# Patient Record
Sex: Female | Born: 1955 | Race: Black or African American | Hispanic: No | Marital: Married | Smoking: Never smoker
Health system: Southern US, Community
[De-identification: ages and names within clinical notes are randomized; demographics above are authoritative.]

## PROBLEM LIST (undated history)

## (undated) DIAGNOSIS — I10 Essential (primary) hypertension: Secondary | ICD-10-CM

## (undated) DIAGNOSIS — E7800 Pure hypercholesterolemia, unspecified: Secondary | ICD-10-CM

## (undated) HISTORY — PX: BREAST BIOPSY: SHX20

---

## 2001-11-14 ENCOUNTER — Emergency Department (HOSPITAL_COMMUNITY): Admission: EM | Admit: 2001-11-14 | Discharge: 2001-11-14 | Payer: Self-pay | Admitting: Internal Medicine

## 2002-05-06 ENCOUNTER — Encounter: Payer: Self-pay | Admitting: Internal Medicine

## 2002-05-06 ENCOUNTER — Ambulatory Visit (HOSPITAL_COMMUNITY): Admission: RE | Admit: 2002-05-06 | Discharge: 2002-05-06 | Payer: Self-pay | Admitting: Internal Medicine

## 2002-06-09 ENCOUNTER — Encounter: Payer: Self-pay | Admitting: Internal Medicine

## 2002-06-09 ENCOUNTER — Ambulatory Visit (HOSPITAL_COMMUNITY): Admission: RE | Admit: 2002-06-09 | Discharge: 2002-06-09 | Payer: Self-pay | Admitting: Internal Medicine

## 2002-06-19 ENCOUNTER — Encounter: Payer: Self-pay | Admitting: Internal Medicine

## 2002-06-19 ENCOUNTER — Ambulatory Visit (HOSPITAL_COMMUNITY): Admission: RE | Admit: 2002-06-19 | Discharge: 2002-06-19 | Payer: Self-pay | Admitting: Internal Medicine

## 2008-02-05 ENCOUNTER — Ambulatory Visit: Payer: Self-pay

## 2010-03-23 ENCOUNTER — Ambulatory Visit: Payer: Self-pay | Admitting: Family Medicine

## 2012-07-23 ENCOUNTER — Ambulatory Visit: Payer: Self-pay | Admitting: Family Medicine

## 2014-11-16 ENCOUNTER — Ambulatory Visit: Payer: Self-pay | Admitting: Nurse Practitioner

## 2014-12-20 ENCOUNTER — Encounter (HOSPITAL_COMMUNITY): Payer: Self-pay | Admitting: Emergency Medicine

## 2014-12-20 ENCOUNTER — Emergency Department (HOSPITAL_COMMUNITY)
Admission: EM | Admit: 2014-12-20 | Discharge: 2014-12-20 | Disposition: A | Discharge status: Home / Self Care | Payer: BLUE CROSS/BLUE SHIELD | Attending: Emergency Medicine | Admitting: Emergency Medicine

## 2014-12-20 ENCOUNTER — Emergency Department (HOSPITAL_COMMUNITY): Payer: BLUE CROSS/BLUE SHIELD

## 2014-12-20 DIAGNOSIS — Z880 Allergy status to penicillin: Secondary | ICD-10-CM | POA: Diagnosis not present

## 2014-12-20 DIAGNOSIS — Z7951 Long term (current) use of inhaled steroids: Secondary | ICD-10-CM | POA: Insufficient documentation

## 2014-12-20 DIAGNOSIS — Z79899 Other long term (current) drug therapy: Secondary | ICD-10-CM | POA: Diagnosis not present

## 2014-12-20 DIAGNOSIS — J019 Acute sinusitis, unspecified: Secondary | ICD-10-CM | POA: Diagnosis not present

## 2014-12-20 DIAGNOSIS — I10 Essential (primary) hypertension: Secondary | ICD-10-CM | POA: Diagnosis not present

## 2014-12-20 DIAGNOSIS — R05 Cough: Secondary | ICD-10-CM

## 2014-12-20 DIAGNOSIS — R9431 Abnormal electrocardiogram [ECG] [EKG]: Secondary | ICD-10-CM | POA: Insufficient documentation

## 2014-12-20 DIAGNOSIS — Z792 Long term (current) use of antibiotics: Secondary | ICD-10-CM | POA: Insufficient documentation

## 2014-12-20 DIAGNOSIS — R9389 Abnormal findings on diagnostic imaging of other specified body structures: Secondary | ICD-10-CM

## 2014-12-20 DIAGNOSIS — R059 Cough, unspecified: Secondary | ICD-10-CM

## 2014-12-20 HISTORY — DX: Pure hypercholesterolemia, unspecified: E78.00

## 2014-12-20 HISTORY — DX: Essential (primary) hypertension: I10

## 2014-12-20 MED ORDER — FLUTICASONE PROPIONATE 50 MCG/ACT NA SUSP: NASAL | Status: DC

## 2014-12-20 MED ORDER — LEVOFLOXACIN 500 MG PO TABS: 500.0000 mg | ORAL_TABLET | Freq: Every day | ORAL | Status: DC

## 2014-12-20 MED ORDER — HYDROCOD POLST-CHLORPHEN POLST 10-8 MG/5ML PO LQCR: 5.0000 mL | Freq: Two times a day (BID) | ORAL | Status: DC

## 2014-12-20 MED ORDER — LEVOFLOXACIN 750 MG PO TABS
750.0000 mg | ORAL_TABLET | Freq: Once | ORAL | Status: AC
  Administered 2014-12-20: 750 mg via ORAL
  Filled 2014-12-20: qty 1

## 2014-12-20 NOTE — ED Provider Notes (Signed)
CSN: [id number]     Arrival date & time 12/20/14  1126 History  This chart was scribed for Rolland Porter, MD by Tonye Royalty, ED Scribe. This patient was seen in room APA10/APA10 and the patient's care was started at 12:11 PM.    Chief Complaint  Patient presents with  . Generalized Body Aches   The history is provided by the patient. No language interpreter was used.    HPI Comments: Dominique Medina is a 59 y.o. female who presents to the Emergency Department complaining of body aches, sore throat, and cough producing green sputum with onset 2 weeks ago, worsening progressively. She reports onset of nasal congestion this week; she states congestion was previously in her chest. She reports associated subjective fever and headache behind her eyes and to the top of her head. She states Robitussin and Tylenol have improved her symptoms. She denies history of asthma, emphysema, or other lung problems. She denies nausea, vomiting, diarrhea, SOB, or pressure in her ears.  Past Medical History  Diagnosis Date  . Hypertension   . High cholesterol    Past Surgical History  Procedure Laterality Date  . Breast biopsy Right    History reviewed. No pertinent family history. History  Substance Use Topics  . Smoking status: Never Smoker   . Smokeless tobacco: Never Used  . Alcohol Use: No   OB History    Gravida Para Term Preterm AB TAB SAB Ectopic Multiple Living   Review of Systems  Constitutional: Positive for fever (subjective). Negative for chills, diaphoresis, appetite change and fatigue.  HENT: Positive for congestion and sore throat. Negative for ear pain, mouth sores and trouble swallowing.   Eyes: Negative for visual disturbance.  Respiratory: Positive for cough. Negative for chest tightness, shortness of breath and wheezing.   Cardiovascular: Negative for chest pain.  Gastrointestinal: Negative for nausea, vomiting, abdominal pain, diarrhea and abdominal distention.   Endocrine: Negative for polydipsia, polyphagia and polyuria.  Genitourinary: Negative for dysuria, frequency and hematuria.  Musculoskeletal: Negative for gait problem.  Skin: Negative for color change, pallor and rash.  Neurological: Positive for headaches. Negative for dizziness, syncope and light-headedness.  Hematological: Does not bruise/bleed easily.  Psychiatric/Behavioral: Negative for behavioral problems and confusion.      Allergies  Penicillins  Home Medications   Prior to Admission medications   Medication Sig Start Date End Date Taking? Authorizing Provider  acetaminophen (TYLENOL) 500 MG tablet Take 500 mg by mouth every 6 (six) hours as needed for fever.   Yes Historical Provider, MD  atorvastatin (LIPITOR) 20 MG tablet Take 1 tablet by mouth daily. 11/14/14  Yes Historical Provider, MD  lisinopril-hydrochlorothiazide (PRINZIDE,ZESTORETIC) 20-12.5 MG per tablet Take 1 tablet by mouth daily. 12/16/14  Yes Historical Provider, MD  Phenyleph-Doxylamine-DM-APAP (ALKA SELTZER PLUS PO) Take 2 tablets by mouth daily as needed (pain).   Yes Historical Provider, MD  potassium chloride (K-DUR) 10 MEQ tablet Take 1 tablet by mouth daily. 12/16/14  Yes Historical Provider, MD  Pseudoeph-Doxylamine-DM-APAP (NYQUIL PO) Take 15 mLs by mouth daily as needed (cold).   Yes Historical Provider, MD  Pseudoephedrine-DM-GG (ROBITUSSIN COLD & COUGH PO) Take 15 mLs by mouth daily as needed (cold).   Yes Historical Provider, MD  chlorpheniramine-HYDROcodone (TUSSIONEX PENNKINETIC ER) 10-8 MG/5ML LQCR Take 5 mLs by mouth every 12 (twelve) hours. 12/20/14   Rolland Porter, MD  fluticasone (FLONASE) 50 MCG/ACT nasal spray 1  spray each nares bid 12/20/14   Rolland PorterMark Keeva Reisen, MD  levofloxacin (LEVAQUIN) 500 MG tablet Take 1 tablet (500 mg total) by mouth daily. 12/20/14   Rolland PorterMark Alfredia Desanctis, MD   BP 139/80 mmHg  Pulse 113  Temp(Src) 98.3 F (36.8 C) (Oral)  Resp 20  Ht 5\' 7"  (1.702 m)  Wt 175 lb (79.379 kg)  BMI  27.40 kg/m2  SpO2 100% Physical Exam  Constitutional: She is oriented to person, place, and time. She appears well-developed and well-nourished. No distress.  HENT:  Head: Normocephalic.  Eyes: Conjunctivae are normal. Pupils are equal, round, and reactive to light. No scleral icterus.  Neck: Normal range of motion. Neck supple. No thyromegaly present.  Cardiovascular: Normal rate and regular rhythm.  Exam reveals no gallop and no friction rub.   No murmur heard. Pulmonary/Chest: Effort normal and breath sounds normal. No respiratory distress. She has no wheezes. She has no rales.  Abdominal: Soft. Bowel sounds are normal. She exhibits no distension. There is no tenderness. There is no rebound.  Musculoskeletal: Normal range of motion.  Neurological: She is alert and oriented to person, place, and time.  Skin: Skin is warm and dry. No rash noted.  Psychiatric: She has a normal mood and affect. Her behavior is normal.  Nursing note and vitals reviewed.   ED Course  Procedures (including critical care time)  DIAGNOSTIC STUDIES: Oxygen Saturation is 100% on room air, normal by my interpretation.    COORDINATION OF CARE: 12:16 PM Discussed treatment plan with patient at beside, antibiotic, inhaler, and cough supressant. The patient agrees with the plan and has no further questions at this time.   Labs Review Labs Reviewed - No data to display  Imaging Review Dg Chest 2 View  12/20/2014   CLINICAL DATA:  Productive cough with weakness and body aches for 2 weeks. History of hypertension. Initial encounter.  EXAM: CHEST  2 VIEW  COMPARISON:  None.  FINDINGS: The heart size and mediastinal contours are normal. There is patchy subpleural opacity anteriorly at the left lung base which is most consistent with atelectasis or scarring. This could reflect resolving pneumonia. The right lung is clear. There is no pleural effusion or pneumothorax. No acute osseous findings are evident.  IMPRESSION:  Patchy subpleural density in the lingula, likely atelectasis or postinflammatory scarring. This could reflect resolving pneumonia. No confluent airspace opacity demonstrated. Radiographic follow up recommended if the patient remains symptomatic.   Electronically Signed   By: Roxy HorsemanBill  Veazey M.D.   On: 12/20/2014 12:14     EKG Interpretation None      MDM   Final diagnoses:  Cough  Acute sinusitis, recurrence not specified, unspecified location  Abnormal chest x-ray   I personally performed the services described in this documentation, which was scribed in my presence. The recorded information has been reviewed and is accurate.   Rolland PorterMark Yuridiana Formanek, MD 12/20/14 1224

## 2014-12-20 NOTE — Discharge Instructions (Signed)
Your chest x-ray is abnormal. It shows an area of scarring, pneumonia (infection), or compression of your lung (atelectasis). After completion of your antibiotics as recommended you see a primary care physician to arrange a follow-up x-ray to ensure that this has resolved. The antibiotic prescribed this for sinus infection. It will also treat a pneumonia.  Cough, Adult  A cough is a reflex that helps clear your throat and airways. It can help heal the body or may be a reaction to an irritated airway. A cough may only last 2 or 3 weeks (acute) or may last more than 8 weeks (chronic).  CAUSES Acute cough:  Viral or bacterial infections. Chronic cough:  Infections.  Allergies.  Asthma.  Post-nasal drip.  Smoking.  Heartburn or acid reflux.  Some medicines.  Chronic lung problems (COPD).  Cancer. SYMPTOMS   Cough.  Fever.  Chest pain.  Increased breathing rate.  High-pitched whistling sound when breathing (wheezing).  Colored mucus that you cough up (sputum). TREATMENT   A bacterial cough may be treated with antibiotic medicine.  A viral cough must run its course and will not respond to antibiotics.  Your caregiver may recommend other treatments if you have a chronic cough. HOME CARE INSTRUCTIONS   Only take over-the-counter or prescription medicines for pain, discomfort, or fever as directed by your caregiver. Use cough suppressants only as directed by your caregiver.  Use a cold steam vaporizer or humidifier in your bedroom or home to help loosen secretions.  Sleep in a semi-upright position if your cough is worse at night.  Rest as needed.  Stop smoking if you smoke. SEEK IMMEDIATE MEDICAL CARE IF:   You have pus in your sputum.  Your cough starts to worsen.  You cannot control your cough with suppressants and are losing sleep.  You begin coughing up blood.  You have difficulty breathing.  You develop pain which is getting worse or is  uncontrolled with medicine.  You have a fever. MAKE SURE YOU:   Understand these instructions.  Will watch your condition.  Will get help right away if you are not doing well or get worse. Document Released: 05/12/2011 Document Revised: 02/05/2012 Document Reviewed: 05/12/2011 [facility] Patient Information 2015 [place], [place]. This information is not intended to replace advice given to you by your health care provider. Make sure you discuss any questions you have with your health care provider.  Sinusitis Sinusitis is redness, soreness, and inflammation of the paranasal sinuses. Paranasal sinuses are air pockets within the bones of your face (beneath the eyes, the middle of the forehead, or above the eyes). In healthy paranasal sinuses, mucus is able to drain out, and air is able to circulate through them by way of your nose. However, when your paranasal sinuses are inflamed, mucus and air can become trapped. This can allow bacteria and other germs to grow and cause infection. Sinusitis can develop quickly and last only a short time (acute) or continue over a long period (chronic). Sinusitis that lasts for more than 12 weeks is considered chronic.  CAUSES  Causes of sinusitis include:  Allergies.  Structural abnormalities, such as displacement of the cartilage that separates your nostrils (deviated septum), which can decrease the air flow through your nose and sinuses and affect sinus drainage.  Functional abnormalities, such as when the small hairs (cilia) that line your sinuses and help remove mucus do not work properly or are not present. SIGNS AND SYMPTOMS  Symptoms of acute and chronic sinusitis are  the same. The primary symptoms are pain and pressure around the affected sinuses. Other symptoms include:  Upper toothache.  Earache.  Headache.  Bad breath.  Decreased sense of smell and taste.  A cough, which worsens when you are lying flat.  Fatigue.  Fever.  Thick  drainage from your nose, which often is green and may contain pus (purulent).  Swelling and warmth over the affected sinuses. DIAGNOSIS  Your health care provider will perform a physical exam. During the exam, your health care provider may:  Look in your nose for signs of abnormal growths in your nostrils (nasal polyps).  Tap over the affected sinus to check for signs of infection.  View the inside of your sinuses (endoscopy) using an imaging device that has a light attached (endoscope). If your health care provider suspects that you have chronic sinusitis, one or more of the following tests may be recommended:  Allergy tests.  Nasal culture. A sample of mucus is taken from your nose, sent to a lab, and screened for bacteria.  Nasal cytology. A sample of mucus is taken from your nose and examined by your health care provider to determine if your sinusitis is related to an allergy. TREATMENT  Most cases of acute sinusitis are related to a viral infection and will resolve on their own within 10 days. Sometimes medicines are prescribed to help relieve symptoms (pain medicine, decongestants, nasal steroid sprays, or saline sprays).  However, for sinusitis related to a bacterial infection, your health care provider will prescribe antibiotic medicines. These are medicines that will help kill the bacteria causing the infection.  Rarely, sinusitis is caused by a fungal infection. In theses cases, your health care provider will prescribe antifungal medicine. For some cases of chronic sinusitis, surgery is needed. Generally, these are cases in which sinusitis recurs more than 3 times per year, despite other treatments. HOME CARE INSTRUCTIONS   Drink plenty of water. Water helps thin the mucus so your sinuses can drain more easily.  Use a humidifier.  Inhale steam 3 to 4 times a day (for example, sit in the bathroom with the shower running).  Apply a warm, moist washcloth to your face 3 to 4  times a day, or as directed by your health care provider.  Use saline nasal sprays to help moisten and clean your sinuses.  Take medicines only as directed by your health care provider.  If you were prescribed either an antibiotic or antifungal medicine, finish it all even if you start to feel better. SEEK IMMEDIATE MEDICAL CARE IF:  You have increasing pain or severe headaches.  You have nausea, vomiting, or drowsiness.  You have swelling around your face.  You have vision problems.  You have a stiff neck.  You have difficulty breathing. MAKE SURE YOU:   Understand these instructions.  Will watch your condition.  Will get help right away if you are not doing well or get worse. Document Released: 11/13/2005 Document Revised: 03/30/2014 Document Reviewed: 11/28/2011 [facility] Patient Information 2015 [place], [place]. This information is not intended to replace advice given to you by your health care provider. Make sure you discuss any questions you have with your health care provider.

## 2014-12-20 NOTE — ED Notes (Signed)
Patient c/o generalized body aches, sore throat, nasal congestion, chills, and productive cough x2 weeks. Per patient progressively getting worse despite using over-counter medication (Robitussin,tylenol, and alchzler plus). Per patient thick green sputum with cough. Unsure of fevers. Denies any nausea, vomiting, or diarrhea.

## 2016-07-06 ENCOUNTER — Other Ambulatory Visit: Payer: Self-pay | Admitting: Internal Medicine

## 2016-07-06 DIAGNOSIS — Z1231 Encounter for screening mammogram for malignant neoplasm of breast: Secondary | ICD-10-CM

## 2016-07-18 ENCOUNTER — Ambulatory Visit
Admission: RE | Admit: 2016-07-18 | Discharge: 2016-07-18 | Disposition: A | Discharge status: Home / Self Care | Payer: BLUE CROSS/BLUE SHIELD | Source: Ambulatory Visit | Attending: Internal Medicine | Admitting: Internal Medicine

## 2016-07-18 ENCOUNTER — Other Ambulatory Visit: Payer: Self-pay | Admitting: Internal Medicine

## 2016-07-18 DIAGNOSIS — Z1231 Encounter for screening mammogram for malignant neoplasm of breast: Secondary | ICD-10-CM | POA: Diagnosis present

## 2016-10-17 ENCOUNTER — Emergency Department: Payer: BLUE CROSS/BLUE SHIELD

## 2016-10-17 ENCOUNTER — Observation Stay
Admission: EM | Admit: 2016-10-17 | Discharge: 2016-10-18 | Disposition: A | Discharge status: Home / Self Care | Payer: BLUE CROSS/BLUE SHIELD | Attending: Internal Medicine | Admitting: Internal Medicine

## 2016-10-17 DIAGNOSIS — Z1623 Resistance to quinolones and fluoroquinolones: Secondary | ICD-10-CM | POA: Insufficient documentation

## 2016-10-17 DIAGNOSIS — Z803 Family history of malignant neoplasm of breast: Secondary | ICD-10-CM | POA: Insufficient documentation

## 2016-10-17 DIAGNOSIS — Z1629 Resistance to other single specified antibiotic: Secondary | ICD-10-CM | POA: Diagnosis not present

## 2016-10-17 DIAGNOSIS — R9431 Abnormal electrocardiogram [ECG] [EKG]: Secondary | ICD-10-CM

## 2016-10-17 DIAGNOSIS — B964 Proteus (mirabilis) (morganii) as the cause of diseases classified elsewhere: Secondary | ICD-10-CM | POA: Insufficient documentation

## 2016-10-17 DIAGNOSIS — Z880 Allergy status to penicillin: Secondary | ICD-10-CM | POA: Insufficient documentation

## 2016-10-17 DIAGNOSIS — E7800 Pure hypercholesterolemia, unspecified: Secondary | ICD-10-CM | POA: Insufficient documentation

## 2016-10-17 DIAGNOSIS — Z79899 Other long term (current) drug therapy: Secondary | ICD-10-CM | POA: Insufficient documentation

## 2016-10-17 DIAGNOSIS — N390 Urinary tract infection, site not specified: Secondary | ICD-10-CM | POA: Diagnosis present

## 2016-10-17 DIAGNOSIS — E860 Dehydration: Principal | ICD-10-CM | POA: Insufficient documentation

## 2016-10-17 DIAGNOSIS — R55 Syncope and collapse: Secondary | ICD-10-CM | POA: Diagnosis present

## 2016-10-17 DIAGNOSIS — J329 Chronic sinusitis, unspecified: Secondary | ICD-10-CM

## 2016-10-17 LAB — URINALYSIS COMPLETE WITH MICROSCOPIC (ARMC ONLY)
Bilirubin Urine: NEGATIVE
GLUCOSE, UA: 50 mg/dL — AB
KETONES UR: NEGATIVE mg/dL
NITRITE: POSITIVE — AB
Protein, ur: 100 mg/dL — AB
Specific Gravity, Urine: 1.014 (ref 1.005–1.030)
pH: 7 (ref 5.0–8.0)

## 2016-10-17 LAB — COMPREHENSIVE METABOLIC PANEL
ALK PHOS: 108 U/L (ref 38–126)
ALT: 37 U/L (ref 14–54)
ANION GAP: 10 (ref 5–15)
AST: 33 U/L (ref 15–41)
Albumin: 4 g/dL (ref 3.5–5.0)
BUN: 17 mg/dL (ref 6–20)
CO2: 28 mmol/L (ref 22–32)
Calcium: 9.4 mg/dL (ref 8.9–10.3)
Chloride: 99 mmol/L — ABNORMAL LOW (ref 101–111)
Creatinine, Ser: 1.36 mg/dL — ABNORMAL HIGH (ref 0.44–1.00)
GFR, EST AFRICAN AMERICAN: 48 mL/min — AB (ref >60)
GFR, EST NON AFRICAN AMERICAN: 42 mL/min — AB (ref >60)
Glucose, Bld: 142 mg/dL — ABNORMAL HIGH (ref 65–99)
Potassium: 2.9 mmol/L — ABNORMAL LOW (ref 3.5–5.1)
SODIUM: 137 mmol/L (ref 135–145)
Total Bilirubin: 0.5 mg/dL (ref 0.3–1.2)
Total Protein: 8.7 g/dL — ABNORMAL HIGH (ref 6.5–8.1)

## 2016-10-17 LAB — CBC
HCT: 43.2 % (ref 35.0–47.0)
HEMOGLOBIN: 15.4 g/dL (ref 12.0–16.0)
MCH: 30.8 pg (ref 26.0–34.0)
MCHC: 35.5 g/dL (ref 32.0–36.0)
MCV: 86.5 fL (ref 80.0–100.0)
Platelets: 342 10*3/uL (ref 150–440)
RBC: 5 MIL/uL (ref 3.80–5.20)
RDW: 13 % (ref 11.5–14.5)
WBC: 9.2 10*3/uL (ref 3.6–11.0)

## 2016-10-17 LAB — INFLUENZA PANEL BY PCR (TYPE A & B)
INFLBPCR: NEGATIVE
Influenza A By PCR: NEGATIVE

## 2016-10-17 LAB — TROPONIN I

## 2016-10-17 MED ORDER — LEVOFLOXACIN IN D5W 750 MG/150ML IV SOLN
750.0000 mg | Freq: Once | INTRAVENOUS | Status: AC
  Administered 2016-10-17: 750 mg via INTRAVENOUS
  Filled 2016-10-17: qty 150

## 2016-10-17 NOTE — ED Notes (Signed)
CT came to get patient . Patient given specimen cup to collect urine when able to

## 2016-10-17 NOTE — ED Notes (Signed)
Admitting MD at bedside.

## 2016-10-17 NOTE — ED Provider Notes (Signed)
[facility] Emergency Department Provider Note   ____________________________________________   First MD Initiated Contact with Patient 10/17/16 2117     (approximate)  I have reviewed the triage vital signs and the nursing notes.   HISTORY  Chief Complaint Loss of Consciousness    HPI Dominique Medina is a 60 y.o. female who reports she's been feeling kind of achy and chilled today was sitting talking to her husband when she just felt woozy got sweaty and passed out. Husband reports she was out for approximately 10 minutes. Patient feels fine now. Does not remember any chest pain or shortness of breath.   Past Medical History:  Diagnosis Date  . High cholesterol   . Hypertension     There are no active problems to display for this patient.   Past Surgical History:  Procedure Laterality Date  . BREAST BIOPSY Right    15 years ago    Prior to Admission medications   Medication Sig Start Date End Date Taking? Authorizing Provider  acetaminophen (TYLENOL) 500 MG tablet Take 500 mg by mouth every 6 (six) hours as needed for fever.   Yes Historical Provider, MD  atorvastatin (LIPITOR) 20 MG tablet Take 1 tablet by mouth daily. 11/14/14  Yes Historical Provider, MD  lisinopril-hydrochlorothiazide (PRINZIDE,ZESTORETIC) 20-12.5 MG per tablet Take 1 tablet by mouth daily. 12/16/14  Yes Historical Provider, MD  Phenyleph-Doxylamine-DM-APAP (ALKA SELTZER PLUS PO) Take 2 tablets by mouth daily as needed (pain).   Yes Historical Provider, MD  potassium chloride (K-DUR) 10 MEQ tablet Take 1 tablet by mouth daily. 12/16/14  Yes Historical Provider, MD  chlorpheniramine-HYDROcodone (TUSSIONEX PENNKINETIC ER) 10-8 MG/5ML LQCR Take 5 mLs by mouth every 12 (twelve) hours. Patient not taking: Reported on 10/17/2016 12/20/14   Rolland PorterMark James, MD  fluticasone [facility](FLONASE) 50 MCG/ACT nasal spray 1 spray each nares bid Patient not taking: Reported on 10/17/2016 12/20/14   Rolland PorterMark  James, MD    Allergies Penicillins  Family History  Problem Relation Age of Onset  . Breast cancer Paternal Aunt 740    Social History Social History  Substance Use Topics  . Smoking status: Never Smoker  . Smokeless tobacco: Never Used  . Alcohol use No    Review of Systems Constitutional: No fever/But did have chills Eyes: No visual changes. ENT: No sore throat. Cardiovascular: Denies chest pain. Respiratory: Denies shortness of breath. Gastrointestinal: No abdominal pain.  No nausea, no vomiting.  No diarrhea.  No constipation. Genitourinary: Negative for dysuria. Musculoskeletal: Negative for back pain. Skin: Negative for rash. Neurological: Negative for headaches, focal weakness or numbness.  10-point ROS otherwise negative.  ____________________________________________   PHYSICAL EXAM:  VITAL SIGNS: ED Triage Vitals  Enc Vitals Group     BP 10/17/16 2049 110/73     Pulse Rate 10/17/16 2049 93     Resp 10/17/16 2049 20     Temp 10/17/16 2049 98.6 F (37 C)     Temp src --      SpO2 10/17/16 2049 97 %     Weight 10/17/16 2050 180 lb (81.6 kg)     Height 10/17/16 2050 5\' 7"  (1.702 m)     Head Circumference --      Peak Flow --      Pain Score --      Pain Loc --      Pain Edu? --      Excl. in GC? --     Constitutional: Alert and oriented.  Well appearing and in no acute distress. Eyes: Conjunctivae are normal. PERRL. EOMI. Head: Atraumatic. Nose: No congestion/rhinnorhea. Mouth/Throat: Mucous membranes are moist.  Oropharynx non-erythematous. Neck: No stridor.  Cardiovascular: Normal rate, regular rhythm. Grossly normal heart sounds.  Good peripheral circulation. Respiratory: Normal respiratory effort.  No retractions. Lungs CTAB. Gastrointestinal: Soft and nontender. No distention. No abdominal bruits. No CVA tenderness. Musculoskeletal: No lower extremity tenderness nor edema.  No joint effusions. Neurologic:  Normal speech and language. No  gross focal neurologic deficits are appreciated. No gait instability.   ____________________________________________   LABS (all labs ordered are listed, but only abnormal results are displayed)  Labs Reviewed  COMPREHENSIVE METABOLIC PANEL - Abnormal; Notable for the following:       Result Value   Potassium 2.9 (*)    Chloride 99 (*)    Glucose, Bld 142 (*)    Creatinine, Ser 1.36 (*)    Total Protein 8.7 (*)    GFR calc non Af Amer 42 (*)    GFR calc Af Amer 48 (*)    All other components within normal limits  URINALYSIS COMPLETEWITH MICROSCOPIC ([facility] ONLY) - Abnormal; Notable for the following:    Color, Urine YELLOW (*)    APPearance CLOUDY (*)    Glucose, UA 50 (*)    Hgb urine dipstick 2+ (*)    Protein, ur 100 (*)    Nitrite POSITIVE (*)    Leukocytes, UA 3+ (*)    Bacteria, UA RARE (*)    Squamous Epithelial / LPF 0-5 (*)    All other components within normal limits  CBC  TROPONIN I  INFLUENZA PANEL BY PCR (TYPE A & B, H1N1)   ____________________________________________  EKG  EKG read and interpreted by me shows sinus tachycardia rate of 102 left axis patient has flipped T's diffusely in the precordial leads and some T-wave flattening inferiorly as well. I do not have any old EKGs to compare to. ____________________________________________  RADIOLOGY  Study Result   CLINICAL DATA:  Nausea, syncope  EXAM: CT HEAD WITHOUT CONTRAST  TECHNIQUE: Contiguous axial images were obtained from the base of the skull through the vertex without intravenous contrast.  COMPARISON:  None.  FINDINGS: Brain: No intracranial hemorrhage, mass effect or midline shift. No acute cortical infarction. No mass lesion is noted on this unenhanced scan.  Vascular: No hyperdense vessel or unexpected calcification.  Skull: Normal. Negative for fracture or focal lesion.  Sinuses/Orbits: There is mucosal thickening with partial opacification right sphenoid sinus. The  mastoid air cells are well aerated.  Other: None  IMPRESSION: 1. No acute intracranial abnormality. No definite acute cortical infarction. Mucosal thickening with partial opacification right sphenoid sinus.   Electronically Signed   By: Natasha MeadLiviu  Pop M.D.   On: 10/17/2016 21:12     ____________________________________________   PROCEDURES  Procedure(s) performed:   Procedures  Critical Care performed:   ____________________________________________   INITIAL IMPRESSION / ASSESSMENT AND PLAN / ED COURSE  Pertinent labs & imaging results that were available during my care of the patient were reviewed by me and considered in my medical decision making (see chart for details).   Clinical Course      ____________________________________________   FINAL CLINICAL IMPRESSION(S) / ED DIAGNOSES  Final diagnoses:  Syncope and collapse  Urinary tract infection without hematuria, site unspecified  Sinusitis, unspecified chronicity, unspecified location  Abnormal EKG      NEW MEDICATIONS STARTED DURING THIS VISIT:  New Prescriptions   No medications  on file     Note:  This document was prepared using Dragon voice recognition software and may include unintentional dictation errors.    Arnaldo Natal, MD 10/17/16 [phone]

## 2016-10-17 NOTE — ED Triage Notes (Signed)
Pt was sitting with husband when she felt nauseous with syncopal episode. Husband states lasted 15 min, states has had cold symptoms for few days.

## 2016-10-18 DIAGNOSIS — R55 Syncope and collapse: Secondary | ICD-10-CM | POA: Diagnosis present

## 2016-10-18 LAB — MAGNESIUM
MAGNESIUM: 2.1 mg/dL (ref 1.7–2.4)
MAGNESIUM: 2.1 mg/dL (ref 1.7–2.4)

## 2016-10-18 LAB — BASIC METABOLIC PANEL
Anion gap: 8 (ref 5–15)
BUN: 14 mg/dL (ref 6–20)
CHLORIDE: 104 mmol/L (ref 101–111)
CO2: 27 mmol/L (ref 22–32)
Calcium: 9.1 mg/dL (ref 8.9–10.3)
Creatinine, Ser: 0.92 mg/dL (ref 0.44–1.00)
GFR calc Af Amer: >60 mL/min (ref >60)
GFR calc non Af Amer: >60 mL/min (ref >60)
GLUCOSE: 104 mg/dL — AB (ref 65–99)
POTASSIUM: 3.1 mmol/L — AB (ref 3.5–5.1)
Sodium: 139 mmol/L (ref 135–145)

## 2016-10-18 LAB — CBC
HEMATOCRIT: 42 % (ref 35.0–47.0)
Hemoglobin: 14.6 g/dL (ref 12.0–16.0)
MCH: 30.3 pg (ref 26.0–34.0)
MCHC: 34.8 g/dL (ref 32.0–36.0)
MCV: 87 fL (ref 80.0–100.0)
Platelets: 313 10*3/uL (ref 150–440)
RBC: 4.83 MIL/uL (ref 3.80–5.20)
RDW: 13.5 % (ref 11.5–14.5)
WBC: 6.8 10*3/uL (ref 3.6–11.0)

## 2016-10-18 LAB — TROPONIN I

## 2016-10-18 LAB — LIPID PANEL
Cholesterol: 159 mg/dL (ref 0–200)
HDL: 53 mg/dL (ref >40)
LDL CALC: 93 mg/dL (ref 0–99)
Total CHOL/HDL Ratio: 3 RATIO
Triglycerides: 64 mg/dL (ref <150)
VLDL: 13 mg/dL (ref 0–40)

## 2016-10-18 LAB — TSH: TSH: 0.737 u[IU]/mL (ref 0.350–4.500)

## 2016-10-18 LAB — PHOSPHORUS: Phosphorus: 2.7 mg/dL (ref 2.5–4.6)

## 2016-10-18 MED ORDER — SODIUM CHLORIDE 0.9% FLUSH
3.0000 mL | Freq: Two times a day (BID) | INTRAVENOUS | Status: DC
  Administered 2016-10-18 (×2): 3 mL via INTRAVENOUS

## 2016-10-18 MED ORDER — ZOLPIDEM TARTRATE 5 MG PO TABS: 5.0000 mg | ORAL_TABLET | Freq: Every evening | ORAL | Status: DC | PRN

## 2016-10-18 MED ORDER — ACETAMINOPHEN 650 MG RE SUPP
650.0000 mg | Freq: Four times a day (QID) | RECTAL | Status: DC | PRN
Start: 2016-10-18 — End: 2016-10-18

## 2016-10-18 MED ORDER — LISINOPRIL 20 MG PO TABS
20.0000 mg | ORAL_TABLET | Freq: Every day | ORAL | Status: DC
  Filled 2016-10-18: qty 1

## 2016-10-18 MED ORDER — LISINOPRIL-HYDROCHLOROTHIAZIDE 20-12.5 MG PO TABS: 1.0000 | ORAL_TABLET | Freq: Every day | ORAL | Status: DC

## 2016-10-18 MED ORDER — HYDROCHLOROTHIAZIDE 12.5 MG PO CAPS
12.5000 mg | ORAL_CAPSULE | Freq: Every day | ORAL | Status: DC
  Administered 2016-10-18: 11:00:00 12.5 mg via ORAL

## 2016-10-18 MED ORDER — HYDROCHLOROTHIAZIDE 12.5 MG PO CAPS
12.5000 mg | ORAL_CAPSULE | Freq: Every day | ORAL | Status: DC
  Filled 2016-10-18: qty 1

## 2016-10-18 MED ORDER — LISINOPRIL 20 MG PO TABS
20.0000 mg | ORAL_TABLET | Freq: Every day | ORAL | Status: DC
  Administered 2016-10-18: 11:00:00 20 mg via ORAL

## 2016-10-18 MED ORDER — HYDROCODONE-ACETAMINOPHEN 5-325 MG PO TABS: 1.0000 | ORAL_TABLET | ORAL | Status: DC | PRN

## 2016-10-18 MED ORDER — SENNOSIDES-DOCUSATE SODIUM 8.6-50 MG PO TABS: 1.0000 | ORAL_TABLET | Freq: Every evening | ORAL | Status: DC | PRN

## 2016-10-18 MED ORDER — ALUM & MAG HYDROXIDE-SIMETH 200-200-20 MG/5ML PO SUSP: 30.0000 mL | Freq: Four times a day (QID) | ORAL | Status: DC | PRN

## 2016-10-18 MED ORDER — LEVOFLOXACIN IN D5W 250 MG/50ML IV SOLN
250.0000 mg | INTRAVENOUS | Status: DC
  Filled 2016-10-18: qty 50

## 2016-10-18 MED ORDER — MAGNESIUM CITRATE PO SOLN: 1.0000 | Freq: Once | ORAL | Status: DC | PRN

## 2016-10-18 MED ORDER — SODIUM CHLORIDE 0.9 % IV SOLN
INTRAVENOUS | Status: DC
  Administered 2016-10-18: 03:00:00 via INTRAVENOUS

## 2016-10-18 MED ORDER — ONDANSETRON HCL 4 MG/2ML IJ SOLN: 4.0000 mg | Freq: Four times a day (QID) | INTRAMUSCULAR | Status: DC | PRN

## 2016-10-18 MED ORDER — ATORVASTATIN CALCIUM 20 MG PO TABS
20.0000 mg | ORAL_TABLET | Freq: Every day | ORAL | Status: DC
  Filled 2016-10-18: qty 1

## 2016-10-18 MED ORDER — CIPROFLOXACIN HCL 500 MG PO TABS: 500.0000 mg | ORAL_TABLET | Freq: Two times a day (BID) | ORAL | 0 refills | Status: DC

## 2016-10-18 MED ORDER — BISACODYL 5 MG PO TBEC: 5.0000 mg | DELAYED_RELEASE_TABLET | Freq: Every day | ORAL | Status: DC | PRN

## 2016-10-18 MED ORDER — POTASSIUM CHLORIDE CRYS ER 20 MEQ PO TBCR
40.0000 meq | EXTENDED_RELEASE_TABLET | Freq: Two times a day (BID) | ORAL | Status: DC
  Administered 2016-10-18 (×2): 40 meq via ORAL
  Filled 2016-10-18 (×2): qty 2

## 2016-10-18 MED ORDER — ONDANSETRON HCL 4 MG PO TABS: 4.0000 mg | ORAL_TABLET | Freq: Four times a day (QID) | ORAL | Status: DC | PRN

## 2016-10-18 MED ORDER — ACETAMINOPHEN 325 MG PO TABS: 650.0000 mg | ORAL_TABLET | Freq: Four times a day (QID) | ORAL | Status: DC | PRN

## 2016-10-18 MED ORDER — ENOXAPARIN SODIUM 40 MG/0.4ML ~~LOC~~ SOLN: 40.0000 mg | SUBCUTANEOUS | Status: DC

## 2016-10-18 NOTE — Discharge Instructions (Signed)
Resume diet and activity as before ° ° °

## 2016-10-18 NOTE — ED Notes (Signed)
Pt assisted to rest room 1x assist

## 2016-10-18 NOTE — Progress Notes (Signed)
Pharmacy Antibiotic Note  Dominique Medina is a 60 y.o. female admitted on 10/17/2016 with UTI.  Pharmacy has been consulted for levofloxacin dosing.  Plan: Levofloxacin 250 mg IV Q24H  Height: 5\' 7"  (170.2 cm) Weight: 180 lb (81.6 kg) IBW/kg (Calculated) : 61.6  Temp (24hrs), Avg:98.3 F (36.8 C), Min:98 F (36.7 C), Max:98.6 F (37 C)   Recent Labs Lab 10/17/16 2055  WBC 9.2  CREATININE 1.36*    Estimated Creatinine Clearance: 48.9 mL/min (by C-G formula based on SCr of 1.36 mg/dL (H)).    Allergies  Allergen Reactions  . Penicillins Itching    Has patient had a PCN reaction causing immediate rash, facial/tongue/throat swelling, SOB or lightheadedness with hypotension: Yes Has patient had a PCN reaction causing severe rash involving mucus membranes or skin necrosis: No Has patient had a PCN reaction that required hospitalization No Has patient had a PCN reaction occurring within the last 10 years: No If all of the above answers are "NO", then may proceed with Cephalosporin use.    Thank you for allowing pharmacy to be a part of this patient's care.  Carola FrostNathan A Cuauhtemoc Huegel, Pharm.D., BCPS Clinical Pharmacist 10/18/2016 2:47 AM

## 2016-10-18 NOTE — Progress Notes (Signed)
Pt has been discharged home. Discharge papers given and explained to pt. Pt verbalized understanding. F/U appointments and meds reviewed with pt. RX given to pt. Pt declined to be escorted in a wheelchair.

## 2016-10-18 NOTE — H&P (Signed)
[facility] [place] [facility] Admission History and Physical [employer] Uvaldo Rybacki, D.O.  ---------------------------------------------------------------------------------------------------------------------   PATIENT NAME: Dominique Medina Bora MR#: [id number] DATE OF BIRTH: 02-15-1956 DATE OF ADMISSION: 10/17/2016 PRIMARY CARE PHYSICIAN: PROVIDER NOT IN SYSTEM  REQUESTING/REFERRING PHYSICIAN: ED Dr. Darnelle CatalanMalinda  CHIEF COMPLAINT: Chief Complaint  Patient presents with  . Loss of Consciousness    HISTORY OF PRESENT ILLNESS: Dominique Medina Oesterling is a 60 y.o. female with a known history of Hypertension, hyperlipidemia presents to the emergency department for evaluation of syncope  patient states that she was in her usual state of health until this morning when she started feeling chilly and achy with sweats intermittently throughout the day. She states that she ate breakfast lunch and dinner but after dinner she was sitting down, felt dizzy and lightheaded, sweaty and as her husband was going to help her he states that she slumped over and lost consciousness. He reports she was unconscious or intermittently conscious for about 10 minutes. He states that she did not have any seizure-like activity, no loss of continence or tongue biting. Patient states that she does not recall any of the events surrounding her loss of consciousness but states that she feels like she is back to baseline, not confsed.  Of note patient states that she has not seen a cardiologist or had any kind of cardiac workup in the past.  Otherwise there has been no change in status. Patient has been taking medication as prescribed and there has been no recent change in medication or diet.  There has been no recent illness, travel or sick contacts.    Patient denies fevers/chills, weakness, dizziness, chest pain, shortness of breath, N/V/C/D, abdominal pain, dysuria/frequency, changes in mental status.   EMS/ED COURSE:   Patient received  Levaquin.  PAST MEDICAL HISTORY: Past Medical History:  Diagnosis Date  . High cholesterol   . Hypertension       PAST SURGICAL HISTORY: Past Surgical History:  Procedure Laterality Date  . BREAST BIOPSY Right    15 years ago      SOCIAL HISTORY: Social History  Substance Use Topics  . Smoking status: Never Smoker  . Smokeless tobacco: Never Used  . Alcohol use No      FAMILY HISTORY: Family History  Problem Relation Age of Onset  . Breast cancer Paternal Aunt [id number]     MEDICATIONS AT HOME: Prior to Admission medications   Medication Sig Start Date End Date Taking? Authorizing Provider  acetaminophen (TYLENOL) 500 MG tablet Take 500 mg by mouth every 6 (six) hours as needed for fever.   Yes Historical Provider, MD  atorvastatin (LIPITOR) 20 MG tablet Take 1 tablet by mouth daily. 11/14/14  Yes Historical Provider, MD  lisinopril-hydrochlorothiazide (PRINZIDE,ZESTORETIC) 20-12.5 MG per tablet Take 1 tablet by mouth daily. 12/16/14  Yes Historical Provider, MD  Phenyleph-Doxylamine-DM-APAP (ALKA SELTZER PLUS PO) Take 2 tablets by mouth daily as needed (pain).   Yes Historical Provider, MD  potassium chloride (K-DUR) 10 MEQ tablet Take 1 tablet by mouth daily. 12/16/14  Yes Historical Provider, MD  chlorpheniramine-HYDROcodone (TUSSIONEX PENNKINETIC ER) 10-8 MG/5ML LQCR Take 5 mLs by mouth every 12 (twelve) hours. Patient not taking: Reported on 10/17/2016 12/20/14   Rolland PorterMark James, MD  fluticasone [facility](FLONASE) 50 MCG/ACT nasal spray 1 spray each nares bid Patient not taking: Reported on 10/17/2016 12/20/14   Rolland PorterMark James, MD      DRUG ALLERGIES: Allergies  Allergen Reactions  . Penicillins Itching    Has patient had  a PCN reaction causing immediate rash, facial/tongue/throat swelling, SOB or lightheadedness with hypotension: Yes Has patient had a PCN reaction causing severe rash involving mucus membranes or skin necrosis: No Has patient had a PCN reaction that required  hospitalization No Has patient had a PCN reaction occurring within the last 10 years: No If all of the above answers are "NO", then may proceed with Cephalosporin use.     REVIEW OF SYSTEMS: CONSTITUTIONAL: Positive fatigue, weakness, fever, chills, negative weight gain/loss, headache EYES: No blurry or double vision. ENT: No tinnitus, postnasal drip, redness or soreness of the oropharynx. Positive sinus congestion RESPIRATORY: No dyspnea, cough, wheeze, hemoptysis. CARDIOVASCULAR: No chest pain, orthopnea, palpitations, syncope. GASTROINTESTINAL: No nausea, vomiting, constipation, diarrhea, abdominal pain. No hematemesis, melena or hematochezia. GENITOURINARY: No dysuria, frequency, hematuria. ENDOCRINE: No polyuria or nocturia. No heat or cold intolerance. HEMATOLOGY: No anemia, bruising, bleeding. INTEGUMENTARY: No rashes, ulcers, lesions. MUSCULOSKELETAL: No pain, arthritis, swelling, gout. NEUROLOGIC: No numbness, tingling, weakness or ataxia. No seizure-type activity. PSYCHIATRIC: No anxiety, depression, insomnia.  PHYSICAL EXAMINATION: VITAL SIGNS: Blood pressure 128/86, pulse 93, temperature 98.6 F (37 C), resp. rate 19, height 5\' 7"  (1.702 m), weight 81.6 kg (180 lb), SpO2 97 %.  GENERAL: 60 y.o.-year-old black female patient, well-developed, well-nourished lying in the bed in no acute distress.  Pleasant and cooperative.   HEENT: Head atraumatic, normocephalic. Pupils equal, round, reactive to light and accommodation. No scleral icterus. Extraocular muscles intact. Oropharynx is clear. Mucus membranes moist. NECK: Supple, full range of motion. No JVD, no bruit heard. No cervical lymphadenopathy. CHEST: Normal breath sounds bilaterally. No wheezing, rales, rhonchi or crackles. No use of accessory muscles of respiration.  No reproducible chest wall tenderness.  CARDIOVASCULAR: S1, S2 normal. No murmurs, rubs, or gallops appreciated. Cap refill <2 seconds. ABDOMEN: Soft,  nontender, nondistended. No rebound, guarding, rigidity. Normoactive bowel sounds present in all four quadrants. No organomegaly or mass. EXTREMITIES: Full range of motion. No pedal edema, cyanosis, or clubbing. NEUROLOGIC: Cranial nerves II through XII are grossly intact with no focal sensorimotor deficit. Muscle strength 5/5 in all extremities. Sensation intact. Gait not checked. PSYCHIATRIC: The patient is alert and oriented x 3. Normal affect, mood, thought content. SKIN: Warm, dry, and intact without obvious rash, lesion, or ulcer.  LABORATORY PANEL:  CBC  Recent Labs Lab 10/17/16 2055  WBC 9.2  HGB 15.4  HCT 43.2  PLT 342   ----------------------------------------------------------------------------------------------------------------- Chemistries  Recent Labs Lab 10/17/16 2055  NA 137  K 2.9*  CL 99*  CO2 28  GLUCOSE 142*  BUN 17  CREATININE 1.36*  CALCIUM 9.4  MG 2.1  AST 33  ALT 37  ALKPHOS 108  BILITOT 0.5   ------------------------------------------------------------------------------------------------------------------ Cardiac Enzymes  Recent Labs Lab 10/17/16 2055  TROPONINI <0.03   ------------------------------------------------------------------------------------------------------------------  RADIOLOGY: Ct Head Wo Contrast  Result Date: 10/17/2016 CLINICAL DATA:  Nausea, syncope EXAM: CT HEAD WITHOUT CONTRAST TECHNIQUE: Contiguous axial images were obtained from the base of the skull through the vertex without intravenous contrast. COMPARISON:  None. FINDINGS: Brain: No intracranial hemorrhage, mass effect or midline shift. No acute cortical infarction. No mass lesion is noted on this unenhanced scan. Vascular: No hyperdense vessel or unexpected calcification. Skull: Normal. Negative for fracture or focal lesion. Sinuses/Orbits: There is mucosal thickening with partial opacification right sphenoid sinus. The mastoid air cells are well aerated.  Other: None IMPRESSION: 1. No acute intracranial abnormality. No definite acute cortical infarction. Mucosal thickening with partial opacification right sphenoid sinus. Electronically Signed  By: Natasha Mead M.D.   On: 10/17/2016 21:12    EKG: Sinus tachycardia at 102 bpm with leftward axis, flipped T waves in the precordial leads and nonspecific ST-T wave changes.   IMPRESSION AND PLAN:  This is a 59 y.o. female with a history of hypertension, hyperlipidemia now being admitted with: 1. Syncope, possibly related to dehydration and infection. I will admit her for observation with telemetry monitoring, trend troponins, check orthostatics, echo and carotids, TSH and lipids. 2. Urinary tract infection. Patient is asymptomatic however given the syncopal episode we'll treat the urinary tract infection present on clean catch urinalysis and follow up urine cultures. 3. AK I, unclear if patient has baseline chronic kidney disease however her story is consistent with dehydration and therefore we will treat with gentle IV fluid rehydration 4. Hypokalemia. Patient is on hydrochlorothiazide and potassium as an outpatient. We'll replace orally and recheck BMP in the a.m. 5. History of hyper-tension-continue lisinopril and HCTZ with potassium 6. History of hyperlipidemia-continue Lipitor  Diet/Nutrition: Heart healthy Fluids: IV normal saline DVT Px: Lovenox, SCDs and early ambulation Code Status: Full  All the records are reviewed and case discussed with ED provider. Management plans discussed with the patient and/or family who express understanding and agree with plan of care.   TOTAL TIME TAKING CARE OF THIS PATIENT: 60 minutes.   Caraline Deutschman D.O. on 10/18/2016 at 1:02 AM Between 7am to 6pm - Pager - [phone] After 6pm go to [email or web address] - Social research officer, government [facility] [place] [facility] Office [phone] CC: Primary care physician; PROVIDER NOT IN SYSTEM     Note: This  dictation was prepared with Dragon dictation along with smaller phrase technology. Any transcriptional errors that result from this process are unintentional.

## 2016-10-20 LAB — URINE CULTURE

## 2016-10-24 NOTE — Discharge Summary (Signed)
[facility] - [place] at [facility]   PATIENT NAME: Dominique Medina    MR#:  [id number]  DATE OF BIRTH:  1956/05/08  DATE OF ADMISSION:  10/17/2016 ADMITTING PHYSICIAN: Jon Gills Hugelmeyer, DO  DATE OF DISCHARGE: 10/18/2016  2:00 PM  PRIMARY CARE PHYSICIAN: PROVIDER NOT IN SYSTEM   ADMISSION DIAGNOSIS:  Syncope and collapse [R55] Abnormal EKG [R94.31] Urinary tract infection without hematuria, site unspecified [N39.0] Sinusitis, unspecified chronicity, unspecified location [J32.9]  DISCHARGE DIAGNOSIS:  Active Problems:   Syncope   SECONDARY DIAGNOSIS:   Past Medical History:  Diagnosis Date  . High cholesterol   . Hypertension      ADMITTING HISTORY  Dominique Medina is a 60 y.o. female with a known history of Hypertension, hyperlipidemia presents to the emergency department for evaluation of syncope  patient states that she was in her usual state of health until this morning when she started feeling chilly and achy with sweats intermittently throughout the day. She states that she ate breakfast lunch and dinner but after dinner she was sitting down, felt dizzy and lightheaded, sweaty and as her husband was going to help her he states that she slumped over and lost consciousness. He reports she was unconscious or intermittently conscious for about 10 minutes. He states that she did not have any seizure-like activity, no loss of continence or tongue biting. Patient states that she does not recall any of the events surrounding her loss of consciousness but states that she feels like she is back to baseline, not confsed.  Of note patient states that she has not seen a cardiologist or had any kind of cardiac workup in the past.  Otherwise there has been no change in status. Patient has been taking medication as prescribed and there has been no recent change in medication or diet.  There has been no recent illness, travel or sick contacts.    Patient denies  fevers/chills, weakness, dizziness, chest pain, shortness of breath, N/V/C/D, abdominal pain, dysuria/frequency, changes in mental status.  HOSPITAL COURSE:   * UTI Started on IV abx in hospital Discharge on PO abx. Afebrile and no abd pain on day of discharge  * Syncope due to dehydration and UTI Improved with IVF. Tele showed no arrhythmias  Discharged home in stable condition  CONSULTS OBTAINED:    DRUG ALLERGIES:   Allergies  Allergen Reactions  . Penicillins Itching    Has patient had a PCN reaction causing immediate rash, facial/tongue/throat swelling, SOB or lightheadedness with hypotension: Yes Has patient had a PCN reaction causing severe rash involving mucus membranes or skin necrosis: No Has patient had a PCN reaction that required hospitalization No Has patient had a PCN reaction occurring within the last 10 years: No If all of the above answers are "NO", then may proceed with Cephalosporin use.    DISCHARGE MEDICATIONS:   Discharge Medication List as of 10/18/2016  1:27 PM    START taking these medications   Details  ciprofloxacin (CIPRO) 500 MG tablet Take 1 tablet (500 mg total) by mouth 2 (two) times daily., Starting Wed 10/18/2016, Print      CONTINUE these medications which have NOT CHANGED   Details  acetaminophen (TYLENOL) 500 MG tablet Take 500 mg by mouth every 6 (six) hours as needed for fever., Until Discontinued, Historical Med    atorvastatin (LIPITOR) 20 MG tablet Take 1 tablet by mouth daily., Starting 11/14/2014, Until Discontinued, Historical Med    lisinopril-hydrochlorothiazide (PRINZIDE,ZESTORETIC) 20-12.5 MG per tablet Take  1 tablet by mouth daily., Starting 12/16/2014, Until Discontinued, Historical Med    Phenyleph-Doxylamine-DM-APAP (ALKA SELTZER PLUS PO) Take 2 tablets by mouth daily as needed (pain)., Until Discontinued, Historical Med    potassium chloride (K-DUR) 10 MEQ tablet Take 1 tablet by mouth daily., Starting 12/16/2014,  Until Discontinued, Historical Med    chlorpheniramine-HYDROcodone (TUSSIONEX PENNKINETIC ER) 10-8 MG/5ML LQCR Take 5 mLs by mouth every 12 (twelve) hours., Starting Sun 12/20/2014, Print    fluticasone (FLONASE) 50 MCG/ACT nasal spray 1 spray each nares bid, Print        Today   VITAL SIGNS:  Blood pressure 128/71, pulse 100, temperature 98.4 F (36.9 C), temperature source Oral, resp. rate 18, height 5\' 7"  (1.702 m), weight 86.6 kg (191 lb), SpO2 99 %.  I/O:  No intake or output data in the 24 hours ending 10/24/16 0319  PHYSICAL EXAMINATION:  Physical Exam  GENERAL:  60 y.o.-year-old patient lying in the bed with no acute distress.  LUNGS: Normal breath sounds bilaterally, no wheezing, rales,rhonchi or crepitation. No use of accessory muscles of respiration.  CARDIOVASCULAR: S1, S2 normal. No murmurs, rubs, or gallops.  ABDOMEN: Soft, non-tender, non-distended. Bowel sounds present. No organomegaly or mass.  NEUROLOGIC: Moves all 4 extremities. PSYCHIATRIC: The patient is alert and oriented x 3.  SKIN: No obvious rash, lesion, or ulcer.   DATA REVIEW:   CBC  Recent Labs Lab 10/18/16 0822  WBC 6.8  HGB 14.6  HCT 42.0  PLT 313    Chemistries   Recent Labs Lab 10/17/16 2055 10/18/16 0325 10/18/16 0822  NA 137  --  139  K 2.9*  --  3.1*  CL 99*  --  104  CO2 28  --  27  GLUCOSE 142*  --  104*  BUN 17  --  14  CREATININE 1.36*  --  0.92  CALCIUM 9.4  --  9.1  MG 2.1 2.1  --   AST 33  --   --   ALT 37  --   --   ALKPHOS 108  --   --   BILITOT 0.5  --   --     Cardiac Enzymes  Recent Labs Lab 10/18/16 0822  TROPONINI <0.03    Microbiology Results  Results for orders placed or performed during the hospital encounter of 10/17/16  Urine culture     Status: Abnormal   Collection Time: 10/17/16  8:55 PM  Result Value Ref Range Status   Specimen Description URINE, RANDOM  Final   Special Requests NONE  Final   Culture >=100,000 COLONIES/mL PROTEUS  MIRABILIS (A)  Final   Report Status 10/20/2016 FINAL  Final   Organism ID, Bacteria PROTEUS MIRABILIS (A)  Final      Susceptibility   Proteus mirabilis - MIC*    AMPICILLIN <=2 SENSITIVE Sensitive     CEFAZOLIN <=4 SENSITIVE Sensitive     CEFTRIAXONE <=1 SENSITIVE Sensitive     CIPROFLOXACIN <=0.25 SENSITIVE Sensitive     GENTAMICIN <=1 SENSITIVE Sensitive     IMIPENEM 0.5 SENSITIVE Sensitive     NITROFURANTOIN 128 RESISTANT Resistant     TRIMETH/SULFA <=20 SENSITIVE Sensitive     AMPICILLIN/SULBACTAM <=2 SENSITIVE Sensitive     PIP/TAZO <=4 SENSITIVE Sensitive     * >=100,000 COLONIES/mL PROTEUS MIRABILIS    RADIOLOGY:  No results found.  Follow up with PCP in 1 week.  Management plans discussed with the patient, family and they are in agreement.  CODE STATUS:  Code Status History    Date Active Date Inactive Code Status Order ID Comments User Context   10/18/2016  2:41 AM 10/18/2016  5:14 PM Full Code [id number]  Tonye RoyaltyAlexis Hugelmeyer, DO Inpatient      TOTAL TIME TAKING CARE OF THIS PATIENT ON DAY OF DISCHARGE: more than 30 minutes.   Milagros LollSudini, Bard Haupert R M.D on 10/24/2016 at 3:19 AM  Between 7am to 6pm - Pager - [phone]  After 6pm go to [email or web address] - password [email or web address]  [facility] [place] [facility]  Office  [phone]  CC: Primary care physician; PROVIDER NOT IN SYSTEM  Note: This dictation was prepared with Dragon dictation along with smaller phrase technology. Any transcriptional errors that result from this process are unintentional.

## 2018-08-21 ENCOUNTER — Other Ambulatory Visit: Payer: Self-pay | Admitting: Family Medicine

## 2018-08-21 DIAGNOSIS — Z1231 Encounter for screening mammogram for malignant neoplasm of breast: Secondary | ICD-10-CM

## 2019-10-08 ENCOUNTER — Other Ambulatory Visit: Payer: Self-pay

## 2019-10-08 ENCOUNTER — Ambulatory Visit
Admission: RE | Admit: 2019-10-08 | Discharge: 2019-10-08 | Disposition: A | Discharge status: Home / Self Care | Payer: BLUE CROSS/BLUE SHIELD | Source: Ambulatory Visit | Attending: Urology | Admitting: Urology

## 2019-10-08 ENCOUNTER — Encounter: Payer: Self-pay | Admitting: Urology

## 2019-10-08 ENCOUNTER — Ambulatory Visit: Payer: BLUE CROSS/BLUE SHIELD | Admitting: Urology

## 2019-10-08 VITALS — BP 174/106 | HR 93 | Ht 67.0 in | Wt 193.0 lb

## 2019-10-08 DIAGNOSIS — N390 Urinary tract infection, site not specified: Secondary | ICD-10-CM

## 2019-10-08 DIAGNOSIS — R3129 Other microscopic hematuria: Secondary | ICD-10-CM

## 2019-10-08 DIAGNOSIS — R8281 Pyuria: Secondary | ICD-10-CM

## 2019-10-08 NOTE — Progress Notes (Signed)
10/08/2019 3:27 PM   Dominique Medina 06-04-1956 [id number]  Referring provider: Abram SanderAdamo, Elena M, MD [address] [place],  [place] [id number]  Chief Complaint  Patient presents with  . Hematuria    New patient    HPI: 63 year old female who presents today for further evaluation of recurrent urinary tract infections.  She was treated 07/28/19 for a Proteus urinary tract infection at which time she had dysuria and lower abdominal pressure and pain.  She returned back to her primary care a few weeks later and her urine remains suspicious.  She had a recurrence of Proteus, although she was relatively asymptomatic at the time she was just treated again for urinary tract infection.  Today she reports that she has some very subtle dysuria but not consistent with her previous UTI symptoms.  Prior to this, she was treated for an infection in 02/2019 which was also difficult to clear.  She denies any flank pain or history of kidney stones.  No gross hematuria.  She is postmenopausal.  No history of malignancy.  Never smoker.   PMH: Past Medical History:  Diagnosis Date  . High cholesterol   . Hypertension     Surgical History: Past Surgical History:  Procedure Laterality Date  . BREAST BIOPSY Right    15 years ago    Home Medications:  Allergies as of 10/08/2019      Reactions   Penicillins Itching   Has patient had a PCN reaction causing immediate rash, facial/tongue/throat swelling, SOB or lightheadedness with hypotension: Yes Has patient had a PCN reaction causing severe rash involving mucus membranes or skin necrosis: No Has patient had a PCN reaction that required hospitalization No Has patient had a PCN reaction occurring within the last 10 years: No If all of the above answers are "NO", then may proceed with Cephalosporin use.      Medication List       Accurate as of October 08, 2019  3:27 PM. If you have any questions, ask your nurse or doctor.         STOP taking these medications   acetaminophen 500 MG tablet Commonly known as: TYLENOL Stopped by: Vanna ScotlandAshley Ioane Bhola, MD   ALKA SELTZER PLUS PO Stopped by: Vanna ScotlandAshley Henchy Mccauley, MD   chlorpheniramine-HYDROcodone 10-8 MG/5ML Lqcr Commonly known as: Tussionex Pennkinetic ER Stopped by: Vanna ScotlandAshley Thijs Brunton, MD   ciprofloxacin 500 MG tablet Commonly known as: Cipro Stopped by: Vanna ScotlandAshley Kynzley Dowson, MD   fluticasone 50 MCG/ACT nasal spray Commonly known as: FLONASE Stopped by: Vanna ScotlandAshley Anvay Tennis, MD   lisinopril-hydrochlorothiazide 20-12.5 MG tablet Commonly known as: ZESTORETIC Stopped by: Vanna ScotlandAshley Prithvi Kooi, MD   potassium chloride 10 MEQ tablet Commonly known as: KLOR-CON Stopped by: Vanna ScotlandAshley Severin Bou, MD     TAKE these medications   amLODipine 5 MG tablet Commonly known as: NORVASC Take 5 mg by mouth daily.   atorvastatin 20 MG tablet Commonly known as: LIPITOR Take 1 tablet by mouth daily.       Allergies:  Allergies  Allergen Reactions  . Penicillins Itching    Has patient had a PCN reaction causing immediate rash, facial/tongue/throat swelling, SOB or lightheadedness with hypotension: Yes Has patient had a PCN reaction causing severe rash involving mucus membranes or skin necrosis: No Has patient had a PCN reaction that required hospitalization No Has patient had a PCN reaction occurring within the last 10 years: No If all of the above answers are "NO", then may proceed with Cephalosporin use.    Family  History: Family History  Problem Relation Age of Onset  . Breast cancer Paternal Aunt 48    Social History:  reports that she has never smoked. She has never used smokeless tobacco. She reports that she does not drink alcohol or use drugs.  ROS: UROLOGY Frequent Urination?: No Hard to postpone urination?: No Burning/pain with urination?: No Get up at night to urinate?: No Leakage of urine?: No Urine stream starts and stops?: No Trouble starting stream?: No Do you have to  strain to urinate?: No Blood in urine?: Yes Urinary tract infection?: No Sexually transmitted disease?: No Injury to kidneys or bladder?: No Painful intercourse?: No Weak stream?: No Currently pregnant?: No Vaginal bleeding?: No Last menstrual period?: n  Gastrointestinal Nausea?: No Vomiting?: No Indigestion/heartburn?: No Diarrhea?: No Constipation?: No  Constitutional Fever: No Night sweats?: No Weight loss?: No Fatigue?: No  Skin Skin rash/lesions?: No Itching?: No  Eyes Blurred vision?: No Double vision?: No  Ears/Nose/Throat Sore throat?: No Sinus problems?: No  Hematologic/Lymphatic Swollen glands?: No Easy bruising?: No  Cardiovascular Leg swelling?: No Chest pain?: No  Respiratory Cough?: No Shortness of breath?: No  Endocrine Excessive thirst?: No  Musculoskeletal Back pain?: No Joint pain?: No  Neurological Headaches?: No Dizziness?: No  Psychologic Depression?: No Anxiety?: No  Physical Exam: BP (!) 174/106   Pulse 93   Ht 5\' 7"  (1.702 m)   Wt 193 lb (87.5 kg)   BMI 30.23 kg/m   Constitutional:  Alert and oriented, No acute distress. HEENT: [place] AT, moist mucus membranes.  Trachea midline, no masses. Cardiovascular: No clubbing, cyanosis, or edema. Respiratory: Normal respiratory effort, no increased work of breathing. Skin: No rashes, bruises or suspicious lesions. Neurologic: Grossly intact, no focal deficits, moving all 4 extremities. Psychiatric: Normal mood and affect.  Laboratory Data:   Urinalysis Results for orders placed or performed in visit on 10/08/19  CULTURE, URINE COMPREHENSIVE   Specimen: Urine   UR  Result Value Ref Range   Urine Culture, Comprehensive Preliminary report    Organism ID, Bacteria Comment   Microscopic Examination   URINE  Result Value Ref Range   WBC, UA 11-30 (A) 0 - 5 /hpf   RBC None seen 0 - 2 /hpf   Epithelial Cells (non renal) 0-10 0 - 10 /hpf   Crystals Present (A) N/A    Crystal Type Amorphous Sediment N/A   Bacteria, UA Few None seen/Few  Urinalysis, Complete  Result Value Ref Range   Specific Gravity, UA 1.015 1.005 - 1.030   pH, UA 7.5 5.0 - 7.5   Color, UA Straw Yellow   Appearance Ur Clear Clear   Leukocytes,UA 3+ (A) Negative   Protein,UA Negative Negative/Trace   Glucose, UA Negative Negative   Ketones, UA Negative Negative   RBC, UA 1+ (A) Negative   Bilirubin, UA Negative Negative   Urobilinogen, Ur 0.2 0.2 - 1.0 mg/dL   Nitrite, UA Negative Negative   Microscopic Examination See below:     Pertinent Imaging: n/a  Assessment & Plan:    1. Recurrent UTI Recurrent Proteus urinary tract infections  Given the organism, suspect she has a underlying kidney stone contributing to her recurrent symptoms  KUB today-we will follow-up as needed  We discussed hygiene techniques, cranberry tablets, probiotics amongst others.  May consider addition of topical estrogen cream if she continues to have infections.  - Urinalysis, Complete - CULTURE, URINE COMPREHENSIVE - Urine Culture, Comprehensive - Abdomen 1 view (KUB); Future  2. Pyuria  Mild pyuria today although minimally symptomatic Culture, treat as needed ?  Collection quality    KUB today- will call with results  Vanna Scotland, MD  [facility] [address], [address] [place], [place] [address] [phone]

## 2019-10-09 ENCOUNTER — Telehealth: Payer: Self-pay | Admitting: Urology

## 2019-10-09 DIAGNOSIS — R1084 Generalized abdominal pain: Secondary | ICD-10-CM

## 2019-10-09 LAB — URINALYSIS, COMPLETE
Bilirubin, UA: NEGATIVE
Glucose, UA: NEGATIVE
Ketones, UA: NEGATIVE
Nitrite, UA: NEGATIVE
Protein,UA: NEGATIVE
Specific Gravity, UA: 1.015 (ref 1.005–1.030)
Urobilinogen, Ur: 0.2 mg/dL (ref 0.2–1.0)
pH, UA: 7.5 (ref 5.0–7.5)

## 2019-10-09 LAB — MICROSCOPIC EXAMINATION: RBC: NONE SEEN /hpf (ref 0–2)

## 2019-10-09 NOTE — Telephone Encounter (Signed)
Call patient to let her know that her KUB showed a large what appears to be full staghorn calculus on the left.  I recommended a noncontrast CT scan for further evaluation of her anatomy.  Negative follow-up with [place] this CT stone protocol is completed to discuss her surgical options.  She is agreeable this plan.  Please arrange.  Hollice Espy, MD

## 2019-10-12 LAB — CULTURE, URINE COMPREHENSIVE

## 2019-10-22 ENCOUNTER — Other Ambulatory Visit: Payer: Self-pay

## 2019-10-22 ENCOUNTER — Ambulatory Visit
Admission: RE | Admit: 2019-10-22 | Discharge: 2019-10-22 | Disposition: A | Discharge status: Home / Self Care | Payer: BLUE CROSS/BLUE SHIELD | Source: Ambulatory Visit | Attending: Urology | Admitting: Urology

## 2019-10-22 DIAGNOSIS — R1084 Generalized abdominal pain: Secondary | ICD-10-CM | POA: Insufficient documentation

## 2019-11-11 ENCOUNTER — Other Ambulatory Visit: Payer: Self-pay

## 2019-11-11 ENCOUNTER — Ambulatory Visit: Payer: BLUE CROSS/BLUE SHIELD | Admitting: Urology

## 2019-11-11 ENCOUNTER — Encounter: Payer: Self-pay | Admitting: Urology

## 2019-11-11 VITALS — BP 138/81 | HR 88 | Ht 64.0 in | Wt 193.0 lb

## 2019-11-11 DIAGNOSIS — N200 Calculus of kidney: Secondary | ICD-10-CM

## 2019-11-11 DIAGNOSIS — N390 Urinary tract infection, site not specified: Secondary | ICD-10-CM

## 2019-11-11 NOTE — Patient Instructions (Signed)
Percutaneous Nephrolithotomy Percutaneous nephrolithotomy is a procedure to remove kidney stones. Kidney stones are deposits that form inside your kidneys and can cause pain. You may need this procedure if:  You have large kidney stones. Kidney stones that are bigger than 2 cm (0.78 in.) wide may require this procedure.  Your kidney stones are oddly shaped.  Other treatments have not been successful in helping the kidney stones to pass.  You have developed an infection due to the kidney stones. Tell a health care provider about:  Any allergies you have.  All medicines you are taking, including vitamins, herbs, eye drops, creams, and over-the-counter medicines.  Any problems you or family members have had with anesthetic medicines.  Any blood disorders you have.  Any surgeries you have had.  Any medical conditions you have.  Whether you are pregnant or may be pregnant.  Whether you use any tobacco products, including cigarettes, chewing tobacco, or e-cigarettes. What are the risks? Generally, this is a safe procedure. However, problems may occur, including:  Infection.  Bleeding. This may include blood in your urine.  Allergic reactions to medicines.  Damage to other structures or organs.  Kidney damage.  Holes in the kidney. These often heal on their own.  Numbness or tingling in the affected area.  Inability to remove all the stones. You may need a different procedure to complete treatment. What happens before the procedure? Staying hydrated Follow instructions from your health care provider about hydration, which may include:  Up to 2 hours before the procedure - you may continue to drink clear liquids, such as water, clear fruit juice, black coffee, and plain tea.  Eating and drinking restrictions Follow instructions from your health care provider about eating and drinking, which may include:  8 hours before the procedure - stop eating heavy meals or foods,  such as meat, fried foods, or fatty foods.  6 hours before the procedure - stop eating light meals or foods, such as toast or cereal.  6 hours before the procedure - stop drinking milk or drinks that contain milk.  2 hours before the procedure - stop drinking clear liquids. Medicines Ask your health care provider about:  Changing or stopping your regular medicines. This is especially important if you are taking diabetes medicines or blood thinners.  Taking medicines such as aspirin and ibuprofen. These medicines can thin your blood. Do not take these medicines unless your health care provider tells you to take them.  Taking over-the-counter medicines, vitamins, herbs, and supplements. Tests You may have tests, including:  Blood tests.  Urine tests.  Tests to check how your heart is working.  Imaging studies. These are used to identify: ? The size and number (stone burden) of the kidney stones. ? The position of the kidney stones. General instructions  Plan to have someone take you home from the hospital or clinic.  Plan to have a responsible adult care for you for at least 24 hours after you leave the hospital or clinic. This is important.  Ask your health care provider how your surgical site will be marked or identified.  Ask your health care provider what steps will be taken to help prevent infection. These may include: ? Removing hair at the surgery site. ? Washing skin with a germ-killing soap. ? Taking antibiotic medicine. What happens during the procedure?   An IV will be inserted into one of your veins.  The site of the procedure will be marked.  You will be   given one or more of the following: ? A medicine to help you relax (sedative). ? A medicine to numb the area (local anesthetic). ? A medicine to make you fall asleep (general anesthetic). ? A medicine that is injected into your spine to numb the area below and slightly above the injection site (spinal  anesthetic). ? A medicine that is injected into an area of your body to numb everything below the injection site (regional anesthetic).  A thin tube (urinary catheter) will be put in your bladder to drain urine during and after the procedure.  Your surgeon will make a small cut (incision) in your lower back.  A tube will be inserted through the incision into your kidney.  Each kidney stone will be removed through this tube. Larger stones may need to be broken up with a high-intensity light beam (laser) or other tools.  After all of the stones have been removed, your health care provider may put in tubes to drain your bladder. Based on your condition: ? An internal tube, called a stent, may be put in your ureter. This will help drain urine from your kidney to your bladder. ? A surgical drain (nephrostomy tube) may be put in your kidney. The tube comes out through the incision in your lower back. This will help to drain urine or any fluid that builds up while your kidney heals.  Part of the incision may be closed with stitches (sutures).  A bandage (dressing) will be placed over the incision area. The procedure may vary among health care providers and hospitals. What happens after the procedure?  Your blood pressure, heart rate, breathing rate, and blood oxygen level will be monitored until you leave the hospital or clinic.  You may be given medicine for pain.  You will be shown how to do breathing exercises, such as coughing and breathing deeply. These will help to prevent pneumonia.  You will be encouraged to walk. Walking helps to prevent blood clots.  Your stent and urinary catheter will be removed after 1-2 days if there is only a small amount of blood in your urine.  You will be taught how to care for the catheter or nephrostomy tube, if you have them.  Do not drive for 24 hours if you were given a sedative during your procedure. Summary  Percutaneous nephrolithotomy is a  procedure to remove kidney stones.  Ask your health care provider about changing or stopping your regular medicines.  Before surgery, follow instructions from your health care provider about eating and drinking.  Plan to have someone take you home from the hospital or clinic. This information is not intended to replace advice given to you by your health care provider. Make sure you discuss any questions you have with your health care provider. Document Released: 09/10/2009 Document Revised: 01/09/2019 Document Reviewed: 06/05/2018 Elsevier Patient Education  2020 [employer]  

## 2019-11-11 NOTE — Progress Notes (Signed)
11/11/2019 7:42 PM   Dominique Medina 04/15/1956 [id number]  Referring provider: [facility] [address] [place],  [place] [address]  Chief Complaint  Patient presents with  . Follow-up    HPI: 63 year old female with recurrent Proteus UTIs found to have a complete left staghorn calculus returns today to discuss surgical management of this.  She is relatively asymptomatic from her stone.  She has no flank pain whatsoever.  Stone was identified incidentally on upper tract imaging for recurrent urinary tract infection.  CT was personally reviewed today with the patient.  She has no urinary tract symptoms including no dysuria or gross hematuria today.  No flank pain.  No previous history of kidney stone or kidney stone surgery.   PMH: Past Medical History:  Diagnosis Date  . High cholesterol   . Hypertension     Surgical History: Past Surgical History:  Procedure Laterality Date  . BREAST BIOPSY Right    15 years ago    Home Medications:  Allergies as of 11/11/2019      Reactions   Penicillins Itching   Has patient had a PCN reaction causing immediate rash, facial/tongue/throat swelling, SOB or lightheadedness with hypotension: Yes Has patient had a PCN reaction causing severe rash involving mucus membranes or skin necrosis: No Has patient had a PCN reaction that required hospitalization No Has patient had a PCN reaction occurring within the last 10 years: No If all of the above answers are "NO", then may proceed with Cephalosporin use.      Medication List       Accurate as of November 11, 2019  7:42 PM. If you have any questions, ask your nurse or doctor.        amLODipine 5 MG tablet Commonly known as: NORVASC Take 5 mg by mouth daily.   atorvastatin 20 MG tablet Commonly known as: LIPITOR Take 1 tablet by mouth daily.       Allergies:  Allergies  Allergen Reactions  . Penicillins Itching    Has patient had a PCN  reaction causing immediate rash, facial/tongue/throat swelling, SOB or lightheadedness with hypotension: Yes Has patient had a PCN reaction causing severe rash involving mucus membranes or skin necrosis: No Has patient had a PCN reaction that required hospitalization No Has patient had a PCN reaction occurring within the last 10 years: No If all of the above answers are "NO", then may proceed with Cephalosporin use.    Family History: Family History  Problem Relation Age of Onset  . Breast cancer Paternal Aunt 43    Social History:  reports that she has never smoked. She has never used smokeless tobacco. She reports that she does not drink alcohol or use drugs.  ROS: UROLOGY Frequent Urination?: No Hard to postpone urination?: No Burning/pain with urination?: No Get up at night to urinate?: No Leakage of urine?: No Urine stream starts and stops?: No Trouble starting stream?: No Do you have to strain to urinate?: No Blood in urine?: No Urinary tract infection?: No Sexually transmitted disease?: No Injury to kidneys or bladder?: No Painful intercourse?: No Weak stream?: No Currently pregnant?: No Vaginal bleeding?: No Last menstrual period?: n  Gastrointestinal Nausea?: No Vomiting?: No Indigestion/heartburn?: No Diarrhea?: No Constipation?: No  Constitutional Fever: No Night sweats?: No Weight loss?: No Fatigue?: No  Skin Skin rash/lesions?: No Itching?: No  Eyes Blurred vision?: No Double vision?: No  Ears/Nose/Throat Sore throat?: No Sinus problems?: No  Hematologic/Lymphatic Swollen glands?: No  Easy bruising?: No  Cardiovascular Leg swelling?: No Chest pain?: No  Respiratory Cough?: No Shortness of breath?: No  Endocrine Excessive thirst?: No  Musculoskeletal Back pain?: No Joint pain?: No  Neurological Headaches?: No Dizziness?: No  Psychologic Depression?: No Anxiety?: No  Physical Exam: BP 138/81   Pulse 88   Ht 5\' 4"   (1.626 m)   Wt 193 lb (87.5 kg)   BMI 33.13 kg/m   Constitutional:  Alert and oriented, No acute distress. HEENT: [place] AT, moist mucus membranes.  Trachea midline, no masses. Cardiovascular: No clubbing, cyanosis, or edema. Respiratory: Normal respiratory effort, no increased work of breathing. Skin: No rashes, bruises or suspicious lesions. Neurologic: Grossly intact, no focal deficits, moving all 4 extremities. Psychiatric: Normal mood and affect.  Laboratory Data: Lab Results  Component Value Date   WBC 6.8 10/18/2016   HGB 14.6 10/18/2016   HCT 42.0 10/18/2016   MCV 87.0 10/18/2016   PLT 313 10/18/2016    Lab Results  Component Value Date   CREATININE 0.92 10/18/2016   Pertinent Imaging: Results for orders placed during the hospital encounter of 10/08/19  Abdomen 1 view (KUB)   Narrative CLINICAL DATA:  Proteus UTI.  EXAM: ABDOMEN - 1 VIEW  COMPARISON:  None.  FINDINGS: There is a large calcification projecting over the patient's left kidney. The bowel gas pattern is nonobstructive and nonspecific. There is a large amount of stool in the right hemicolon. No definite evidence for a right-sided kidney stone.  IMPRESSION: Findings concerning for large left-sided staghorn calculus. This can be further evaluated with a noncontrast CT of the abdomen and pelvis.   Electronically Signed   By: Katherine Mantlehristopher  Green M.D.   On: 10/08/2019 20:14    Results for orders placed during the hospital encounter of 10/22/19  CT RENAL STONE STUDY   Narrative CLINICAL DATA:  Microscopic hematuria. Bilateral flank pain, right greater than left.  EXAM: CT ABDOMEN AND PELVIS WITHOUT CONTRAST  TECHNIQUE: Multidetector CT imaging of the abdomen and pelvis was performed following the standard protocol without IV contrast.  COMPARISON:  Abdominal radiograph 10/08/2019  FINDINGS: Lower chest: Mild scarring or atelectasis in the lingula.  Hepatobiliary: Mild dependent density in  the gallbladder, likely sludge, less likely gallstones. Otherwise unremarkable.  Pancreas: Unremarkable  Spleen: Unremarkable  Adrenals/Urinary Tract: As shown on conventional radiography, there is a large collection of staghorn calculi filling and dilating most of the left renal collecting system, with individual branching stones up to about 5.4 cm in length. Scarring along the left kidney upper pole with calyceal diverticula forming almost cyst-like protuberances along the upper pole. Hypodensity along portions of the left kidney lower pole are not entirely determinate but probably from calyceal diverticula as well. Given the heavy left stone burden, I am surprised that there isn't more scarring and atrophy in the left kidney.  On the right side there is a 2 mm lower pole calculus on image 67/4. No ureteral or bladder calculus.  Adrenal glands unremarkable.  Stomach/Bowel: There are few scattered diverticula of the sigmoid colon.  Vascular/Lymphatic: Mild aortoiliac atherosclerotic vascular calcification. No pathologic adenopathy identified.  Reproductive: Unremarkable  Other: No supplemental non-categorized findings.  Musculoskeletal: Mild grade 1 degenerative anterolisthesis at L4-5.  IMPRESSION: 1. Large collection of staghorn calculi filling and dilating most of the left renal collecting system. There is some associated scarring and atrophy of the left kidney upper pole along with calyceal diverticula in the upper pole and lower pole. 2. 2 mm lower  pole right renal pole nonobstructive calculus. 3. Aortic atherosclerosis. 4. Mild dependent density in the gallbladder, likely sludge, less likely gallstones. 5. Mild sigmoid colon diverticulosis.  Aortic Atherosclerosis (ICD10-I70.0).   Electronically Signed   By: Van Clines M.D.   On: 10/22/2019 14:01    CT scan and KUB are personally reviewed today.  Images were also reviewed with the patient  personally.  Assessment & Plan:    1. 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  There does appear to be a significant salvageable parenchyma.  We discussed the procedure at length today including the risk of bleeding, infection, damage to surrounding structures, need for staged procedure which is highly likely based on the size of the procedure, will likely plan for staged ureteroscopy following PCNL to treat any residual stone burden.  We discussed the need for at least overnight observation with nephrostomy tube and Foley catheter in place and likely indwelling ureteral stent.  Role of interventional radiology was also discussed.  Due to COVID-19 crisis and the cancellation of inpatient/observation procedures due to current senses, we will delay this procedure until at least January February.  She understands all of this.  2. Recurrent UTI Secondary to #1  We discussed that she will likely continue to have recurrent infections of bleeding cancer likely struvite stone burden.  We discussed the risk of bacteremia during the procedure, will receive IV antibiotics per procedurally.   Hollice Espy, MD  [facility] [address] [place], [place] [address] [phone]  I spent 25 min with this patient of which greater than 50% was spent in counseling and coordination of care with the patient.

## 2020-04-20 IMAGING — CT CT RENAL STONE PROTOCOL
2 of 4 series · 15 of 46 positions shown, 17 images · non-contrast
Comparison: Abdominal radiograph 10/08/2019

CLINICAL DATA: Microscopic hematuria. Bilateral flank pain, right
greater than left.

EXAM:
CT ABDOMEN AND PELVIS WITHOUT CONTRAST
TECHNIQUE: Multidetector CT imaging of the abdomen and pelvis was performed
following the standard protocol without IV contrast.

[Series 2: renal stone 5.00 · axial · 0.66mm/px · z∈[-1893,-1473]mm · 12 of 92 slices shown, 14 images]
[im 4/92  soft-tissue]
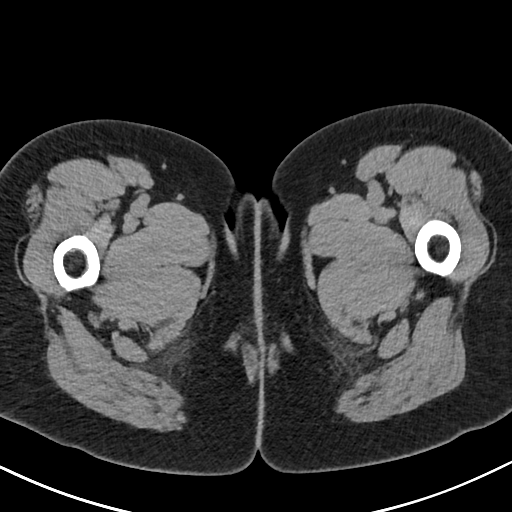
[im 4/92  bone]
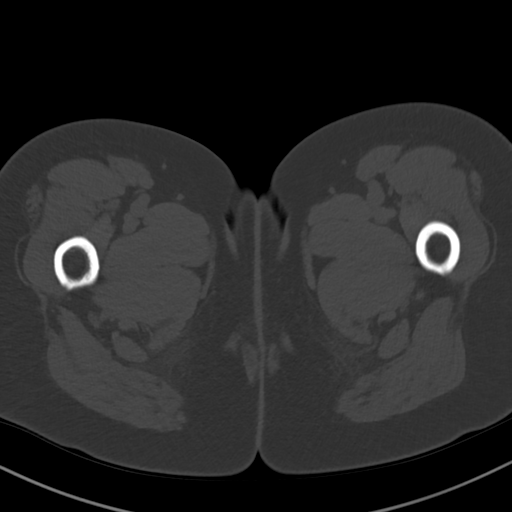
[im 12/92  soft-tissue]
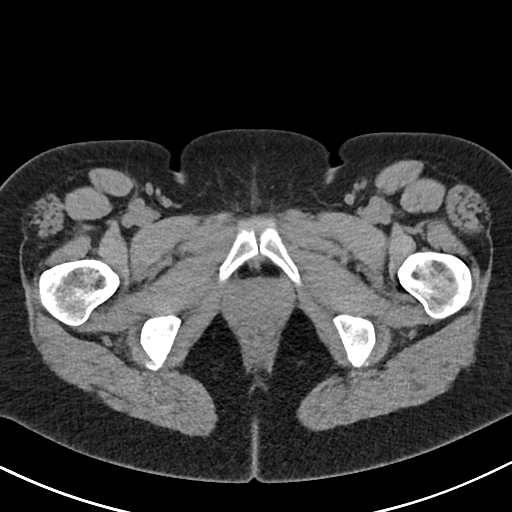
[im 19/92  soft-tissue]
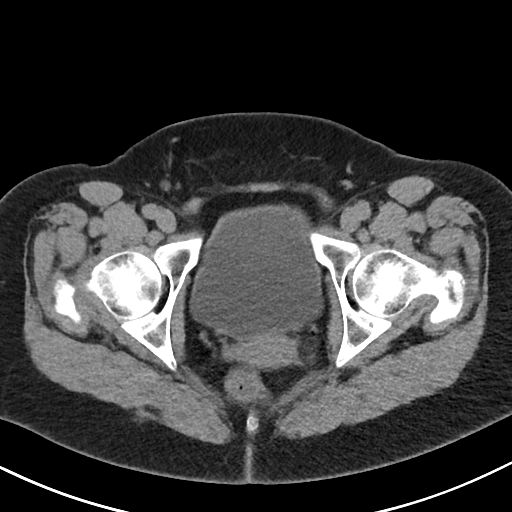
[im 27/92  soft-tissue]
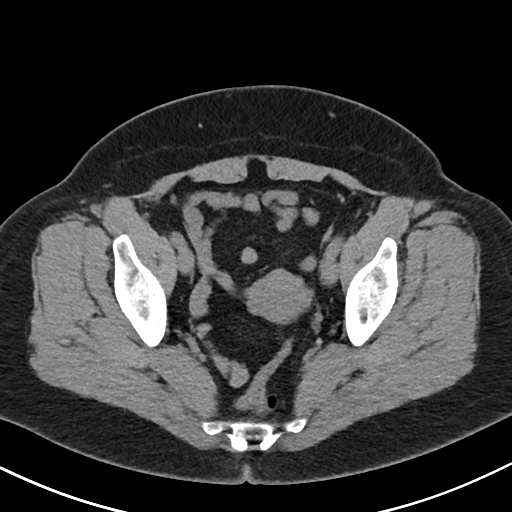
[im 35/92  soft-tissue]
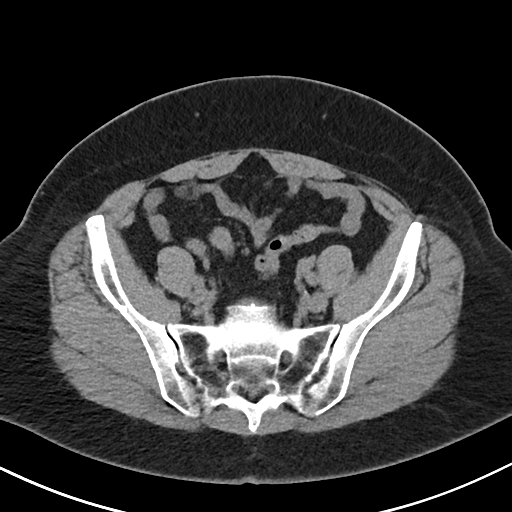
[im 42/92  soft-tissue]
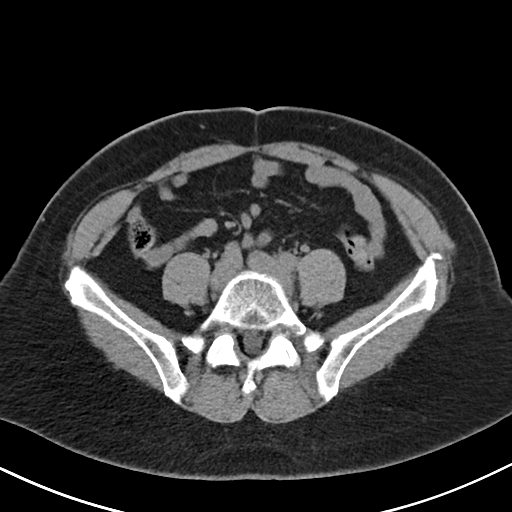
[im 50/92  soft-tissue]
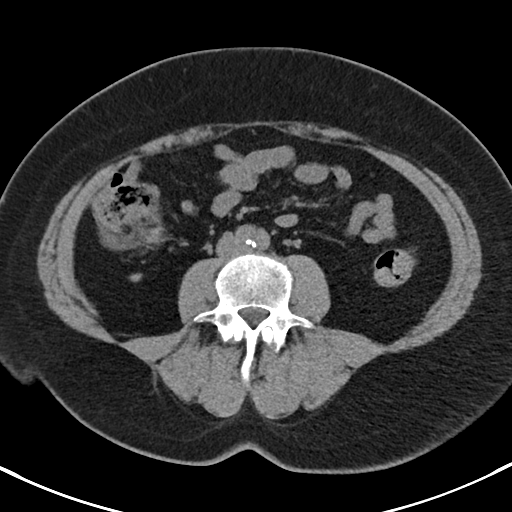
[im 57/92  soft-tissue]
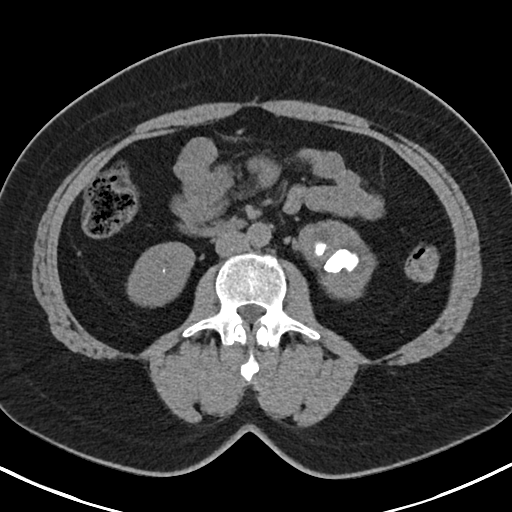
[im 65/92  soft-tissue]
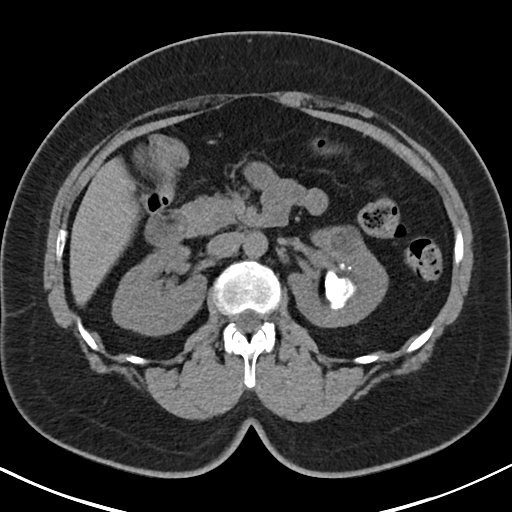
[im 65/92  bone]
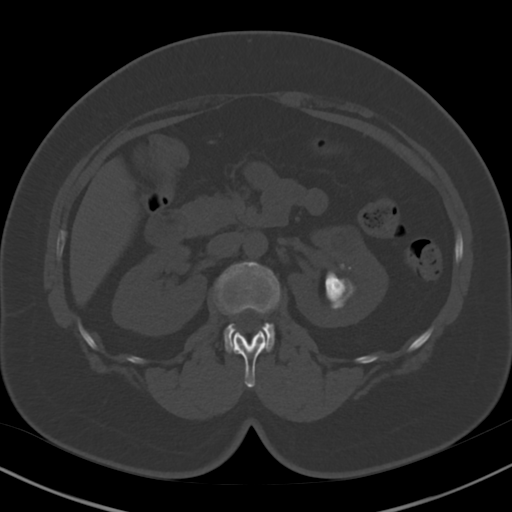
[im 73/92  soft-tissue]
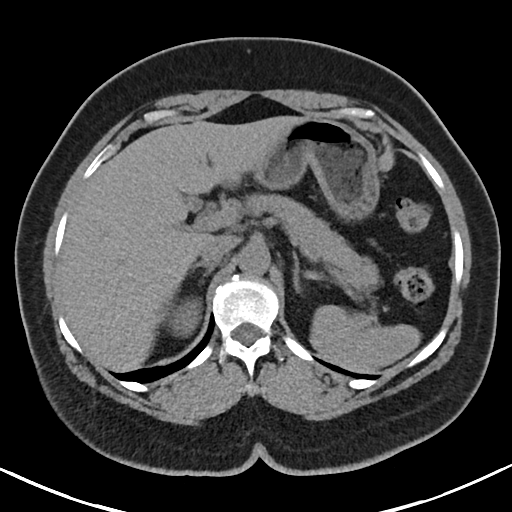
[im 80/92  soft-tissue]
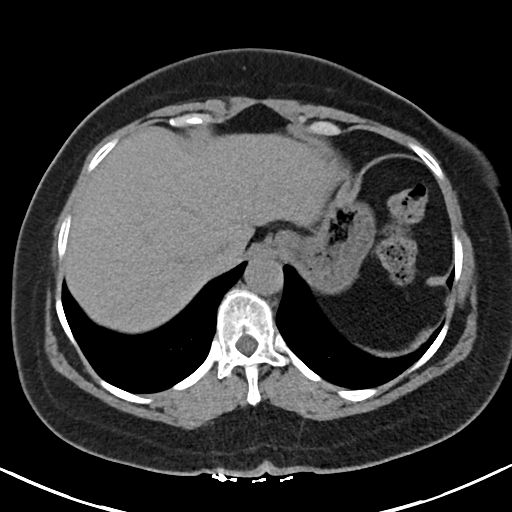
[im 88/92  soft-tissue]
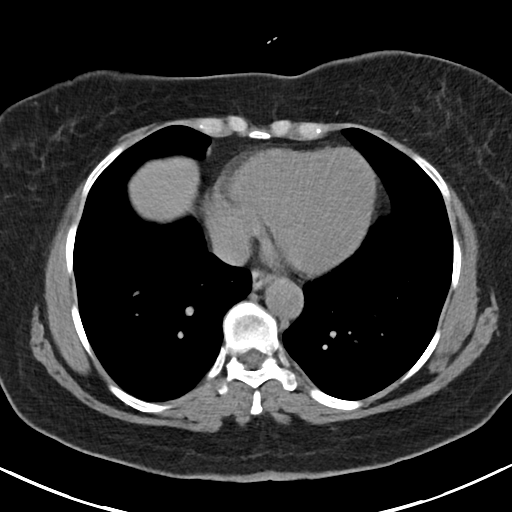

[Series 4: renal stone 2.00 cor · coronal · 0.66mm/px · 3 of 144 slices shown]
[im 48/144  soft-tissue]
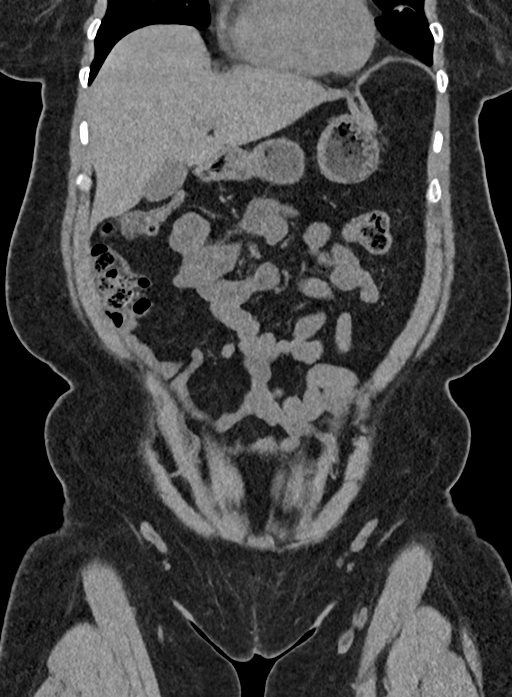
[im 64/144  soft-tissue]
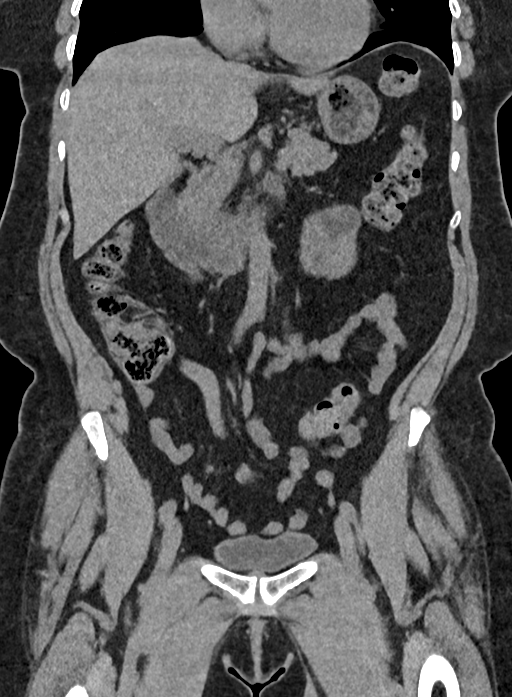
[im 80/144  soft-tissue]
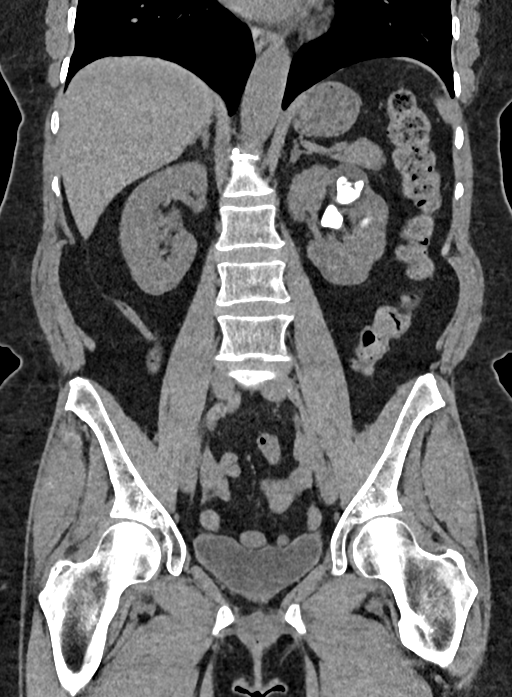

[15 of 46 positions shown; findings below may reference images not displayed]

FINDINGS: Lower chest: Mild scarring or atelectasis in the lingula.

Hepatobiliary: Mild dependent density in the gallbladder, likely
sludge, less likely gallstones. Otherwise unremarkable.

Pancreas: Unremarkable

Spleen: Unremarkable

Adrenals/Urinary Tract: As shown on conventional radiography, there
is a large collection of staghorn calculi filling and dilating most
of the left renal collecting system, with individual branching
stones up to about 5.4 cm in length. Scarring along the left kidney
upper pole with calyceal diverticula forming almost cyst-like
protuberances along the upper pole. Hypodensity along portions of
the left kidney lower pole are not entirely determinate but probably
from calyceal diverticula as well. Given the heavy left stone
burden, I am surprised that there isn't more scarring and atrophy in
the left kidney.

On the right side there is a 2 mm lower pole calculus on image 67/4.
No ureteral or bladder calculus.

Adrenal glands unremarkable.

Stomach/Bowel: There are few scattered diverticula of the sigmoid
colon.

Vascular/Lymphatic: Mild aortoiliac atherosclerotic vascular
calcification. No pathologic adenopathy identified.

Reproductive: Unremarkable

Other: No supplemental non-categorized findings.

Musculoskeletal: Mild grade 1 degenerative anterolisthesis at L4-5.
IMPRESSION: 1. Large collection of staghorn calculi filling and dilating most of
the left renal collecting system. There is some associated scarring
and atrophy of the left kidney upper pole along with calyceal
diverticula in the upper pole and lower pole.
2. 2 mm lower pole right renal pole nonobstructive calculus.
3. Aortic atherosclerosis.
4. Mild dependent density in the gallbladder, likely sludge, less
likely gallstones.
5. Mild sigmoid colon diverticulosis.

Aortic Atherosclerosis ([id number]).
